# Patient Record
Sex: Female | Born: 2000 | Hispanic: Yes | Marital: Single | Smoking: Never smoker
Health system: Southern US, Community
[De-identification: ages and names within clinical notes are randomized; demographics above are authoritative.]

## PROBLEM LIST (undated history)

## (undated) ENCOUNTER — Inpatient Hospital Stay (HOSPITAL_COMMUNITY): Payer: Self-pay

## (undated) DIAGNOSIS — F32A Depression, unspecified: Secondary | ICD-10-CM

## (undated) DIAGNOSIS — D649 Anemia, unspecified: Secondary | ICD-10-CM

## (undated) HISTORY — DX: Depression, unspecified: F32.A

---
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
## 2021-11-23 ENCOUNTER — Emergency Department (HOSPITAL_COMMUNITY): Payer: Medicaid Other

## 2021-11-23 ENCOUNTER — Emergency Department (HOSPITAL_COMMUNITY)
Admission: EM | Admit: 2021-11-23 | Discharge: 2021-11-23 | Disposition: A | Discharge status: Home / Self Care | Payer: Medicaid Other | Attending: Emergency Medicine | Admitting: Emergency Medicine

## 2021-11-23 ENCOUNTER — Other Ambulatory Visit: Payer: Self-pay

## 2021-11-23 ENCOUNTER — Encounter (HOSPITAL_COMMUNITY): Payer: Self-pay

## 2021-11-23 DIAGNOSIS — M545 Low back pain, unspecified: Secondary | ICD-10-CM | POA: Diagnosis not present

## 2021-11-23 DIAGNOSIS — Y92410 Unspecified street and highway as the place of occurrence of the external cause: Secondary | ICD-10-CM | POA: Insufficient documentation

## 2021-11-23 DIAGNOSIS — G8911 Acute pain due to trauma: Secondary | ICD-10-CM | POA: Insufficient documentation

## 2021-11-23 DIAGNOSIS — M542 Cervicalgia: Secondary | ICD-10-CM | POA: Insufficient documentation

## 2021-11-23 NOTE — ED Notes (Signed)
Pt reported to RN that she needed to leave because her boyfriend was leaving the ED and that is her only ride home. RN encouraged pt to stay to allow the MD to provide her with results. Again, pt insisted on leaving due to transportation issues. Pt seen ambulating out of ED, steady gait, NAD. Return precautions provided prior to departure.
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
## 2022-08-11 DIAGNOSIS — O35EXX0 Maternal care for other (suspected) fetal abnormality and damage, fetal genitourinary anomalies, not applicable or unspecified: Secondary | ICD-10-CM | POA: Insufficient documentation

## 2022-08-11 HISTORY — DX: Maternal care for other (suspected) fetal abnormality and damage, fetal genitourinary anomalies, not applicable or unspecified: O35.EXX0

## 2022-08-23 ENCOUNTER — Encounter (HOSPITAL_COMMUNITY): Payer: Self-pay | Admitting: Obstetrics and Gynecology

## 2022-08-23 ENCOUNTER — Inpatient Hospital Stay (HOSPITAL_BASED_OUTPATIENT_CLINIC_OR_DEPARTMENT_OTHER): Payer: Medicaid Other

## 2022-08-23 ENCOUNTER — Emergency Department (HOSPITAL_COMMUNITY)
Admission: EM | Admit: 2022-08-23 | Discharge: 2022-08-23 | Discharge status: Against Medical Advice | Payer: Medicaid Other | Source: Home / Self Care

## 2022-08-23 ENCOUNTER — Inpatient Hospital Stay (HOSPITAL_COMMUNITY)
Admission: AD | Admit: 2022-08-23 | Discharge: 2022-08-23 | Disposition: A | Discharge status: Home / Self Care | Payer: Medicaid Other | Attending: Obstetrics and Gynecology | Admitting: Obstetrics and Gynecology

## 2022-08-23 DIAGNOSIS — Y929 Unspecified place or not applicable: Secondary | ICD-10-CM | POA: Insufficient documentation

## 2022-08-23 DIAGNOSIS — T7491XA Unspecified adult maltreatment, confirmed, initial encounter: Secondary | ICD-10-CM

## 2022-08-23 DIAGNOSIS — O99012 Anemia complicating pregnancy, second trimester: Secondary | ICD-10-CM | POA: Insufficient documentation

## 2022-08-23 DIAGNOSIS — O9A219 Injury, poisoning and certain other consequences of external causes complicating pregnancy, unspecified trimester: Secondary | ICD-10-CM | POA: Diagnosis present

## 2022-08-23 DIAGNOSIS — S3991XA Unspecified injury of abdomen, initial encounter: Secondary | ICD-10-CM | POA: Insufficient documentation

## 2022-08-23 DIAGNOSIS — Z3A20 20 weeks gestation of pregnancy: Secondary | ICD-10-CM | POA: Diagnosis not present

## 2022-08-23 DIAGNOSIS — O9A212 Injury, poisoning and certain other consequences of external causes complicating pregnancy, second trimester: Secondary | ICD-10-CM

## 2022-08-23 DIAGNOSIS — O26892 Other specified pregnancy related conditions, second trimester: Secondary | ICD-10-CM | POA: Insufficient documentation

## 2022-08-23 LAB — CBC
HCT: 30.4 % — ABNORMAL LOW (ref 36.0–46.0)
Hemoglobin: 10.5 g/dL — ABNORMAL LOW (ref 12.0–15.0)
MCH: 31.3 pg (ref 26.0–34.0)
MCHC: 34.5 g/dL (ref 30.0–36.0)
MCV: 90.5 fL (ref 80.0–100.0)
Platelets: 214 10*3/uL (ref 150–400)
RBC: 3.36 MIL/uL — ABNORMAL LOW (ref 3.87–5.11)
RDW: 13.1 % (ref 11.5–15.5)
WBC: 9 10*3/uL (ref 4.0–10.5)
nRBC: 0 % (ref 0.0–0.2)

## 2022-08-23 MED ORDER — CYCLOBENZAPRINE HCL 5 MG PO TABS
10.0000 mg | ORAL_TABLET | Freq: Once | ORAL | Status: AC
  Administered 2022-08-23: 10 mg via ORAL
  Filled 2022-08-23: qty 2
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
## 2023-03-15 ENCOUNTER — Emergency Department (HOSPITAL_COMMUNITY)
Admission: EM | Admit: 2023-03-15 | Discharge: 2023-03-16 | Disposition: A | Discharge status: Home / Self Care | Payer: Medicaid Other | Attending: Emergency Medicine | Admitting: Emergency Medicine

## 2023-03-15 DIAGNOSIS — Z1152 Encounter for screening for COVID-19: Secondary | ICD-10-CM | POA: Insufficient documentation

## 2023-03-15 DIAGNOSIS — R509 Fever, unspecified: Secondary | ICD-10-CM | POA: Diagnosis present

## 2023-03-15 DIAGNOSIS — B349 Viral infection, unspecified: Secondary | ICD-10-CM

## 2023-03-16 ENCOUNTER — Other Ambulatory Visit: Payer: Self-pay

## 2023-03-16 ENCOUNTER — Encounter (HOSPITAL_COMMUNITY): Payer: Self-pay | Admitting: *Deleted

## 2023-03-16 LAB — RESP PANEL BY RT-PCR (RSV, FLU A&B, COVID)  RVPGX2
Influenza A by PCR: NEGATIVE
Influenza B by PCR: POSITIVE — AB
Resp Syncytial Virus by PCR: NEGATIVE
SARS Coronavirus 2 by RT PCR: NEGATIVE

## 2023-03-16 MED ORDER — ACETAMINOPHEN 325 MG PO TABS
650.0000 mg | ORAL_TABLET | Freq: Once | ORAL | Status: AC | PRN
  Administered 2023-03-16: 650 mg via ORAL
  Filled 2023-03-16: qty 2

## 2023-03-16 NOTE — ED Triage Notes (Addendum)
Pt has been having a fever since Friday, unrelieved with OTC medications. Reports recent sickness of her child. C/o headache. Pt is 2 months postpartum and breast feeding, denies any issues breastfeeding

## 2023-03-16 NOTE — Discharge Instructions (Addendum)
We recommend Delsym and/or Benzocaine cough drops/lozenges for management of cough and sore throat.  These can be purchased over-the-counter at your local pharmacy.  A teaspoon of honey can also help reduce cough frequency.  Continue Tylenol and/or ibuprofen every 6 hours for fever management.  Follow-up with your primary care doctor.  You may also follow the results of your respiratory panel using MyChart.  Return for new or concerning symptoms.

## 2023-03-16 NOTE — ED Provider Notes (Signed)
[place] EMERGENCY DEPARTMENT AT [facility] Provider Note   CSN: [id number] Arrival date & time: 03/15/23  2318     History  Chief Complaint  Patient presents with   Fever    Sarah Bond is a 22 y.o. female.  22 year old female presents to the emergency department for evaluation of fever.  Fever has been tactile since Friday with associated fatigue, body aches, cough and sore throat.  Reports that her 42-year-old son was sick with similar symptoms 1 week ago which have since resolved.  She has been using over-the-counter Tylenol and ibuprofen for fever with mild improvement.  Continues to experience chills and myalgias.  No nausea, vomiting, diarrhea, shortness of breath.  She is 2 months postpartum and is presently breast-feeding.  The history is provided by the patient. No language interpreter was used.  Fever      Home Medications Prior to Admission medications   Medication Sig Start Date End Date Taking? Authorizing Provider  ferrous sulfate 325 (65 FE) MG tablet Take 1 tablet (325 mg total) by mouth 2 (two) times daily with a meal. 09/08/18 11/07/18  Allayne Stack, DO  FLUoxetine (PROZAC) 10 MG capsule Take 2 capsules (20 mg total) by mouth daily. 11/01/18   Marylene Land, CNM  ibuprofen (ADVIL,MOTRIN) 600 MG tablet Take 1 tablet (600 mg total) by mouth every 6 (six) hours as needed. 09/08/18   Allayne Stack, DO  Prenatal Vit-Fe Fumarate-FA (MULTIVITAMIN-PRENATAL) 27-0.8 MG TABS tablet Take 1 tablet by mouth daily at 12 noon.    [provider]  senna-docusate (SENOKOT-S) 8.6-50 MG tablet Take 2 tablets by mouth daily. Patient not taking: Reported on 11/01/2018 09/09/18   Allayne Stack, DO      Allergies    Patient has no known allergies.    Review of Systems   Review of Systems  Constitutional:  Positive for fever.  Ten systems reviewed and are negative for acute change, except as noted in the HPI.    Physical  Exam Updated Vital Signs BP 96/74   Pulse 91   Temp 98.6 F (37 C) (Oral)   Resp 16   Ht 5\' 5"  (1.651 m)   Wt 63.5 kg   LMP  (LMP Unknown) Comment: recent abortion two weeks ago-waiver signed  SpO2 96%   BMI 23.30 kg/m   Physical Exam Vitals and nursing note reviewed.  Constitutional:      General: She is not in acute distress.    Appearance: She is well-developed. She is not diaphoretic.     Comments: Ill appearing; fatigued. Nontoxic.  HENT:     Head: Normocephalic and atraumatic.     Mouth/Throat:     Mouth: Mucous membranes are moist.     Comments: No posterior oropharyngeal erythema, edema, exudates.  Uvula midline with normal phonation.  Patient tolerating secretions without difficulty. Eyes:     General: No scleral icterus.    Conjunctiva/sclera: Conjunctivae normal.  Neck:     Comments: No meningismus Cardiovascular:     Rate and Rhythm: Regular rhythm. Tachycardia present.     Pulses: Normal pulses.     Comments: Tachycardia likely secondary to fever Pulmonary:     Effort: Pulmonary effort is normal. No respiratory distress.     Breath sounds: No stridor. No wheezing.     Comments: Respirations even and unlabored Musculoskeletal:        General: Normal range of motion.     Cervical back: Normal range of  motion.  Skin:    General: Skin is warm and dry.     Coloration: Skin is not pale.     Findings: No erythema or rash.  Neurological:     Mental Status: She is alert and oriented to person, place, and time.     Coordination: Coordination normal.  Psychiatric:        Behavior: Behavior normal.     ED Results / Procedures / Treatments   Labs (all labs ordered are listed, but only abnormal results are displayed) Labs Reviewed  RESP PANEL BY RT-PCR (RSV, FLU A&B, COVID)  [id number]    EKG None  Radiology No results found.  Procedures Procedures    Medications Ordered in ED Medications  acetaminophen (TYLENOL) tablet 650 mg (650 mg Oral Given  03/16/23 0039)    ED Course/ Medical Decision Making/ A&P Clinical Course as of 03/16/23 0343  Tue Mar 16, 2023  0332 Patient resting comfortably and feeling better.  Fever has defervesced with improvement in vital signs. [KH]    Clinical Course User Index [KH] Antony Madura, PA-C                             Medical Decision Making Risk OTC drugs.   This patient presents to the ED for concern of febrile illness, this involves an extensive number of treatment options, and is a complaint that carries with it a high risk of complications and morbidity.  The differential diagnosis includes viral syndrome vs strep throat vs PNA vs sepsis   Co morbidities that complicate the patient evaluation  2 months postpartum   Additional history obtained:  External records from outside source obtained and reviewed including delivery records from 12/31/22   Lab Tests:  I Ordered, and personally interpreted labs.  The pertinent results include:  respiratory viral panel pending.   Cardiac Monitoring:  The patient was maintained on a cardiac monitor.  I personally viewed and interpreted the cardiac monitored which showed an underlying rhythm of: sinus tachycardia > NSR   Medicines ordered and prescription drug management:  I ordered medication including tylenol for fever  Reevaluation of the patient after these medicines showed that the patient improved I have reviewed the patients home medicines and have made adjustments as needed   Test Considered:  CXR -however no tachypnea, dyspnea, hypoxia to suggest pneumonia.  No complaints of shortness of breath or chest pain.   Problem List / ED Course:  Patient presenting for febrile illness.  Fever is tactile, but responding appropriately to antipyretics.  Reports that 65-year-old son had similar URI symptoms and fever last week.  Likely viral syndrome, though respiratory panel has yet to result. Given improvement in fever and vital signs,  general well-appearing nature patient, plan for discharge and primary care follow-up.  Counseled on supportive care instructions as well as return precautions.   Reevaluation:  After the interventions noted above, I reevaluated the patient and found that they have :improved   Social Determinants of Health:  Breastfeeding    Dispostion:  After consideration of the diagnostic results and the patients response to treatment, I feel that the patent would benefit from supportive care and PCP follow up. Return precautions discussed and provided. Patient discharged in stable condition with no unaddressed concerns.          Final Clinical Impression(s) / ED Diagnoses Final diagnoses:  Viral syndrome    Rx / DC Orders ED Discharge Orders  None         Antony Madura, PA-C 03/16/23 1610    Shon Baton, MD 03/16/23 [phone]

## 2023-09-05 DIAGNOSIS — Z5321 Procedure and treatment not carried out due to patient leaving prior to being seen by health care provider: Secondary | ICD-10-CM | POA: Insufficient documentation

## 2023-09-05 DIAGNOSIS — N939 Abnormal uterine and vaginal bleeding, unspecified: Secondary | ICD-10-CM | POA: Diagnosis present

## 2023-09-06 ENCOUNTER — Encounter (HOSPITAL_COMMUNITY): Payer: Self-pay

## 2023-09-06 ENCOUNTER — Other Ambulatory Visit: Payer: Self-pay

## 2023-09-06 ENCOUNTER — Emergency Department (HOSPITAL_COMMUNITY)
Admission: EM | Admit: 2023-09-06 | Discharge: 2023-09-06 | Discharge status: Against Medical Advice | Payer: Medicaid Other | Attending: Emergency Medicine | Admitting: Emergency Medicine

## 2023-09-06 LAB — CBC
HCT: 34.5 % — ABNORMAL LOW (ref 36.0–46.0)
Hemoglobin: 11.1 g/dL — ABNORMAL LOW (ref 12.0–15.0)
MCH: 30.6 pg (ref 26.0–34.0)
MCHC: 32.2 g/dL (ref 30.0–36.0)
MCV: 95 fL (ref 80.0–100.0)
Platelets: 214 10*3/uL (ref 150–400)
RBC: 3.63 MIL/uL — ABNORMAL LOW (ref 3.87–5.11)
RDW: 13.5 % (ref 11.5–15.5)
WBC: 4.4 10*3/uL (ref 4.0–10.5)
nRBC: 0 % (ref 0.0–0.2)

## 2023-09-06 LAB — HCG, SERUM, QUALITATIVE: Preg, Serum: NEGATIVE

## 2023-09-06 NOTE — ED Triage Notes (Signed)
Complaining of having heavy vaginal bleeding for the last couple of days. Today she was passing golfball size blood clots. She said she feels weak. Has not been to her GYN

## 2023-09-06 NOTE — ED Notes (Signed)
Patient left.
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
## 2023-11-05 ENCOUNTER — Emergency Department (HOSPITAL_BASED_OUTPATIENT_CLINIC_OR_DEPARTMENT_OTHER)
Admission: EM | Admit: 2023-11-05 | Discharge: 2023-11-05 | Disposition: A | Discharge status: Home / Self Care | Payer: Medicaid Other | Attending: Emergency Medicine | Admitting: Emergency Medicine

## 2023-11-05 ENCOUNTER — Other Ambulatory Visit: Payer: Self-pay

## 2023-11-05 ENCOUNTER — Emergency Department (HOSPITAL_BASED_OUTPATIENT_CLINIC_OR_DEPARTMENT_OTHER): Payer: Medicaid Other

## 2023-11-05 ENCOUNTER — Encounter (HOSPITAL_BASED_OUTPATIENT_CLINIC_OR_DEPARTMENT_OTHER): Payer: Self-pay

## 2023-11-05 DIAGNOSIS — S61217A Laceration without foreign body of left little finger without damage to nail, initial encounter: Secondary | ICD-10-CM | POA: Diagnosis not present

## 2023-11-05 DIAGNOSIS — S61211A Laceration without foreign body of left index finger without damage to nail, initial encounter: Secondary | ICD-10-CM | POA: Insufficient documentation

## 2023-11-05 DIAGNOSIS — S61215A Laceration without foreign body of left ring finger without damage to nail, initial encounter: Secondary | ICD-10-CM | POA: Insufficient documentation

## 2023-11-05 DIAGNOSIS — Y93H2 Activity, gardening and landscaping: Secondary | ICD-10-CM | POA: Diagnosis not present

## 2023-11-05 DIAGNOSIS — S61213A Laceration without foreign body of left middle finger without damage to nail, initial encounter: Secondary | ICD-10-CM | POA: Insufficient documentation

## 2023-11-05 DIAGNOSIS — W268XXA Contact with other sharp object(s), not elsewhere classified, initial encounter: Secondary | ICD-10-CM | POA: Diagnosis not present

## 2023-11-05 DIAGNOSIS — S41112A Laceration without foreign body of left upper arm, initial encounter: Secondary | ICD-10-CM

## 2023-11-05 DIAGNOSIS — S61012A Laceration without foreign body of left thumb without damage to nail, initial encounter: Secondary | ICD-10-CM | POA: Insufficient documentation

## 2023-11-05 MED ORDER — ACETAMINOPHEN 325 MG PO TABS
650.0000 mg | ORAL_TABLET | Freq: Once | ORAL | Status: AC
  Administered 2023-11-05: 650 mg via ORAL
  Filled 2023-11-05: qty 2

## 2023-11-05 MED ORDER — OXYCODONE-ACETAMINOPHEN 5-325 MG PO TABS
1.0000 | ORAL_TABLET | Freq: Once | ORAL | Status: DC
  Filled 2023-11-05: qty 1

## 2023-11-05 MED ORDER — BUPIVACAINE HCL (PF) 0.5 % IJ SOLN
10.0000 mL | Freq: Once | INTRAMUSCULAR | Status: DC
  Filled 2023-11-05: qty 10
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
## 2023-11-05 NOTE — ED Provider Notes (Signed)
[place] EMERGENCY DEPARTMENT AT [facility] Provider Note   CSN: [id number] Arrival date & time: 11/05/23  1553     History  Chief Complaint  Patient presents with   Laceration    L hand    Sarah Bond is a 22 y.o. female presents today after cutting the palmar aspect of all 5 fingers while trimming some bushes.  Patient endorses pain.  Patient denies numbness or tingling.  Patient states that her tetanus is up-to-date.  Bleeding controlled with direct pressure.   Laceration      Home Medications Prior to Admission medications   Medication Sig Start Date End Date Taking? Authorizing Provider  ferrous sulfate 325 (65 FE) MG tablet Take 1 tablet (325 mg total) by mouth 2 (two) times daily with a meal. 09/08/18 11/07/18  Allayne Stack, DO  FLUoxetine (PROZAC) 10 MG capsule Take 2 capsules (20 mg total) by mouth daily. 11/01/18   Marylene Land, CNM  ibuprofen (ADVIL,MOTRIN) 600 MG tablet Take 1 tablet (600 mg total) by mouth every 6 (six) hours as needed. 09/08/18   Allayne Stack, DO  Prenatal Vit-Fe Fumarate-FA (MULTIVITAMIN-PRENATAL) 27-0.8 MG TABS tablet Take 1 tablet by mouth daily at 12 noon.    [provider]  senna-docusate (SENOKOT-S) 8.6-50 MG tablet Take 2 tablets by mouth daily. Patient not taking: Reported on 11/01/2018 09/09/18   Allayne Stack, DO      Allergies    Patient has no known allergies.    Review of Systems   Review of Systems  Musculoskeletal:  Positive for arthralgias.    Physical Exam Updated Vital Signs BP 106/83 (BP Location: Right Arm)   Pulse 95   Temp 97.9 F (36.6 C)   Resp 16   LMP 10/29/2023   SpO2 100%  Physical Exam Vitals and nursing note reviewed.  Constitutional:      General: She is not in acute distress.    Appearance: She is well-developed.  HENT:     Head: Normocephalic and atraumatic.     Right Ear: External ear normal.     Left Ear: External ear normal.  Eyes:      Conjunctiva/sclera: Conjunctivae normal.  Cardiovascular:     Rate and Rhythm: Normal rate and regular rhythm.     Pulses: Normal pulses.  Pulmonary:     Effort: Pulmonary effort is normal.     Breath sounds: Normal breath sounds.  Abdominal:     Palpations: Abdomen is soft.     Tenderness: There is no abdominal tenderness.  Musculoskeletal:        General: No swelling.     Cervical back: Normal range of motion and neck supple.  Skin:    General: Skin is warm and dry.     Capillary Refill: Capillary refill takes less than 2 seconds.     Comments: Approximately 1 cm lacerations to the palmar aspect of the second third fourth and fifth fingers near the PIP.  1 cm [place] shaped laceration to the thumb at the DIP.  Patient has full range of motion and is neurovascularly intact.  Neurological:     General: No focal deficit present.     Mental Status: She is alert.  Psychiatric:        Mood and Affect: Mood normal.     ED Results / Procedures / Treatments   Labs (all labs ordered are listed, but only abnormal results are displayed) Labs Reviewed - No data to display  EKG None  Radiology DG Hand Complete Left Result Date: 11/05/2023 CLINICAL DATA:  Laceration of multiple fingers EXAM: LEFT HAND - COMPLETE 3+ VIEW COMPARISON:  None Available. FINDINGS: Overlying bandaging material slightly degrades assessment of the thumb, long finger, ring finger, and small finger. There is no evidence of fracture or dislocation. There is no evidence of arthropathy or other focal bone abnormality. No appreciable soft tissue swelling within the limitations of this exam. No metallic foreign body is evident. IMPRESSION: No acute osseous abnormality or metallic foreign body of the left hand. Electronically Signed   By: Duanne Guess D.O.   On: 11/05/2023 18:59    Procedures .Laceration Repair  Date/Time: 11/05/2023 7:27 PM  Performed by: Dolphus Jenny, PA-C Authorized by: Dolphus Jenny,  PA-C   Consent:    Consent obtained:  Verbal   Consent given by:  Patient   Risks discussed:  Pain and poor wound healing   Alternatives discussed:  No treatment Universal protocol:    Patient identity confirmed:  Verbally with patient Anesthesia:    Anesthesia method:  Local infiltration   Local anesthetic:  Bupivacaine 0.5% w/o epi Laceration details:    Location:  Finger   Finger location:  L thumb   Length (cm):  1   Depth (mm):  2 Exploration:    Hemostasis achieved with:  Direct pressure   Imaging obtained: x-ray     Imaging outcome: foreign body not noted     Wound exploration: wound explored through full range of motion and entire depth of wound visualized   Treatment:    Area cleansed with:  Chlorhexidine   Amount of cleaning:  Extensive   Irrigation solution:  Sterile saline   Irrigation method:  Pressure wash Skin repair:    Repair method:  Sutures   Suture size:  5-0   Suture material:  Nylon   Number of sutures:  3 Approximation:    Approximation:  Close Repair type:    Repair type:  Simple Post-procedure details:    Dressing:  Non-adherent dressing   Procedure completion:  Tolerated .Laceration Repair  Date/Time: 11/05/2023 7:28 PM  Performed by: Dolphus Jenny, PA-C Authorized by: Dolphus Jenny, PA-C   Consent:    Consent obtained:  Verbal   Consent given by:  Patient   Risks discussed:  Pain, infection and poor wound healing   Alternatives discussed:  No treatment Universal protocol:    Patient identity confirmed:  Verbally with patient Anesthesia:    Anesthesia method:  Local infiltration   Local anesthetic:  Bupivacaine 0.5% w/o epi Laceration details:    Location:  Finger   Finger location:  L small finger   Length (cm):  1   Depth (mm):  2 Exploration:    Hemostasis achieved with:  Direct pressure   Imaging obtained: x-ray     Imaging outcome: foreign body not noted     Wound exploration: wound explored through full range of motion  and entire depth of wound visualized   Treatment:    Area cleansed with:  Chlorhexidine   Amount of cleaning:  Extensive   Irrigation solution:  Sterile saline   Irrigation method:  Pressure wash Skin repair:    Repair method:  Sutures   Suture size:  5-0   Suture material:  Nylon   Number of sutures:  3 Approximation:    Approximation:  Close Repair type:    Repair type:  Simple Post-procedure details:    Procedure  completion:  Tolerated     Medications Ordered in ED Medications  bupivacaine(PF) (MARCAINE) 0.5 % injection 10 mL (has no administration in time range)  acetaminophen (TYLENOL) tablet 650 mg (650 mg Oral Given 11/05/23 1709)    ED Course/ Medical Decision Making/ A&P                                 Medical Decision Making Amount and/or Complexity of Data Reviewed Radiology: ordered.  Risk Prescription drug management.   This patient presents to the ED with chief complaint(s) of left hand lacerations with pertinent past medical history of none which further complicates the presenting complaint. The complaint involves an extensive differential diagnosis and also carries with it a high risk of complications and morbidity.    The differential diagnosis includes laceration, fracture  ED Course and Reassessment:   Independent visualization of imaging: - I independently visualized the following imaging with scope of interpretation limited to determining acute life threatening conditions related to emergency care: Left hand x-ray, which revealed no acute osseous abnormality or foreign body noted  Consultation: - Consulted or discussed management/test interpretation w/ external professional: None  Consideration for admission or further workup: Considered for admission or further workup however patient's vital signs, physical exam, and imaging of all been reassuring.  Patient should follow-up with PCP in approximately 1 week for suture  removal.        Final Clinical Impression(s) / ED Diagnoses Final diagnoses:  Laceration of multiple sites of left upper extremity, initial encounter    Rx / DC Orders ED Discharge Orders     None         Dolphus Jenny, PA-C 11/05/23 1930    Ernie Avena, MD 11/05/23 1942

## 2023-11-10 ENCOUNTER — Other Ambulatory Visit: Payer: Self-pay

## 2023-11-10 ENCOUNTER — Encounter: Payer: Self-pay | Admitting: *Deleted

## 2023-11-10 ENCOUNTER — Ambulatory Visit
Admission: EM | Admit: 2023-11-10 | Discharge: 2023-11-10 | Disposition: A | Discharge status: Home / Self Care | Payer: Medicaid Other | Attending: Internal Medicine | Admitting: Internal Medicine

## 2023-11-10 DIAGNOSIS — S61412A Laceration without foreign body of left hand, initial encounter: Secondary | ICD-10-CM | POA: Diagnosis not present

## 2023-11-10 DIAGNOSIS — S61219A Laceration without foreign body of unspecified finger without damage to nail, initial encounter: Secondary | ICD-10-CM | POA: Diagnosis not present

## 2023-11-10 NOTE — ED Provider Notes (Signed)
[facility]    CSN: [id number] Arrival date & time: 11/10/23  1047      History   Chief Complaint Chief Complaint  Patient presents with   Wound Check    HPI Sarah Bond is a 22 y.o. female.    Wound Check  Multiple lacerations left hand cut by hedge tremor 5 days ago, seen in the emergency department states 2 of her fingers were sutured 2 of them were glued.  He has concerns due to drainage from the glued wounds, also complaining of numbness in her little finger.  Nuys fever, chills, sweats, redness, swelling  Past Medical History:  Diagnosis Date   Anemia    iron infusion during pregnancy    Patient Active Problem List   Diagnosis Date Noted   Postpartum care and examination 11/01/2018   Nexplanon in place 09/08/2018   Labor and delivery, indication for care 09/06/2018   Post-dates pregnancy 09/06/2018   Anemia affecting pregnancy 09/06/2018   Rubella non-immune status, antepartum 03/25/2018   Major depression, single episode 03/21/2015   Suicide attempt (HCC) 03/21/2015   Generalized anxiety disorder 03/21/2015    Past Surgical History:  Procedure Laterality Date   I & D EXTREMITY Left 08/08/2015   Procedure: IRRIGATION AND DEBRIDEMENT LEFT THUMB WITH REPAIR AND RECONSTRUCTION AS NEEDED;  Surgeon: Dominica Severin, MD;  Location: [facility] OR;  Service: Orthopedics;  Laterality: Left;    OB History     Gravida  2   Para  1   Term  1   Preterm  0   AB  0   Living  1      SAB  0   IAB  0   Ectopic  0   Multiple  0   Live Births  1            Home Medications    Prior to Admission medications   Medication Sig Start Date End Date Taking? Authorizing Provider  ferrous sulfate 325 (65 FE) MG tablet Take 1 tablet (325 mg total) by mouth 2 (two) times daily with a meal. Patient not taking: Reported on 11/10/2023 09/08/18 11/07/18  Allayne Stack, DO  FLUoxetine (PROZAC) 10 MG capsule Take 2 capsules (20 mg total) by mouth  daily. Patient not taking: Reported on 11/10/2023 11/01/18   Marylene Land, CNM  ibuprofen (ADVIL,MOTRIN) 600 MG tablet Take 1 tablet (600 mg total) by mouth every 6 (six) hours as needed. Patient not taking: Reported on 11/10/2023 09/08/18   Allayne Stack, DO  Prenatal Vit-Fe Fumarate-FA (MULTIVITAMIN-PRENATAL) 27-0.8 MG TABS tablet Take 1 tablet by mouth daily at 12 noon. Patient not taking: Reported on 11/10/2023    [provider]  senna-docusate (SENOKOT-S) 8.6-50 MG tablet Take 2 tablets by mouth daily. Patient not taking: Reported on 11/01/2018 09/09/18   Allayne Stack, DO    Family History Family History  Problem Relation Age of Onset   Diabetes Mother     Social History Social History   Tobacco Use   Smoking status: Never   Smokeless tobacco: Never  Vaping Use   Vaping status: Never Used  Substance Use Topics   Alcohol use: No   Drug use: Never     Allergies   Patient has no known allergies.   Review of Systems Review of Systems   Physical Exam Triage Vital Signs ED Triage Vitals  Encounter Vitals Group     BP 11/10/23 1143 103/72     Systolic  BP Percentile --      Diastolic BP Percentile --      Pulse Rate 11/10/23 1143 74     Resp 11/10/23 1143 16     Temp 11/10/23 1143 98.4 F (36.9 C)     Temp Source 11/10/23 1143 Oral     SpO2 11/10/23 1143 98 %     Weight --      Height --      Head Circumference --      Peak Flow --      Pain Score 11/10/23 1140 4     Pain Loc --      Pain Education --      Exclude from Growth Chart --    No data found.  Updated Vital Signs BP 103/72 (BP Location: Right Arm)   Pulse 74   Temp 98.4 F (36.9 C) (Oral)   Resp 16   LMP 10/29/2023   SpO2 98%   Breastfeeding Yes   Visual Acuity Right Eye Distance:   Left Eye Distance:   Bilateral Distance:    Right Eye Near:   Left Eye Near:    Bilateral Near:     Physical Exam Skin:    Comments: Left fifth finger and thumb have  sutured wounds with good healing no evidence of infection, index middle and ring fingers have wounds with glue, scant drainage, no erythema, is all digits, decreased sensation volar lateral aspect fifth finger. Digits with full range of motion, capillary refill brisk  Neurological:     Mental Status: She is alert and oriented to person, place, and time.  Psychiatric:        Mood and Affect: Mood normal.      UC Treatments / Results  Labs (all labs ordered are listed, but only abnormal results are displayed) Labs Reviewed - No data to display  EKG   Radiology No results found.  Procedures Procedures (including critical care time)  Medications Ordered in UC Medications - No data to display  Initial Impression / Assessment and Plan / UC Course  I have reviewed the triage vital signs and the nursing notes.  Pertinent labs & imaging results that were available during my care of the patient were reviewed by me and considered in my medical decision making (see chart for details).     22 year old female with multiple wounds left hand, wounds were cleaned with Hibiclens, skin glue came off during the cleaning process, middle and ring finger wounds are moist and macerated appearing, there is no evidence of infection. Home management of wounds reviewed with patient, keep wounds clean, dry and covered.  Recommend she follow-up with hand regarding numbness in the pinky.  She was given a list of local orthopedists Final Clinical Impressions(s) / UC Diagnoses   Final diagnoses:  Laceration of multiple sites of left hand and fingers, initial encounter     Discharge Instructions      Keep wounds clean, dry and covered Follow-up with a hand specialist as discussed regarding the numbness in your little finger Medical attention immediately for signs of infection clued redness, swelling, increased pain, pus    ED Prescriptions   None    PDMP not reviewed this encounter.    Meliton Rattan, [place] 11/10/23 1159

## 2023-11-10 NOTE — Discharge Instructions (Signed)
Keep wounds clean, dry and covered Follow-up with a hand specialist as discussed regarding the numbness in your little finger Medical attention immediately for signs of infection clued redness, swelling, increased pain, pus

## 2023-11-10 NOTE — ED Triage Notes (Signed)
Pt seen in ED on Friday for injury to left hand from hedge trimmers. She had stitches and glue to all fingers on left hand. States ring finger continues to bleed and she has seen 'pus" from ring finger and index finger on left hand

## 2023-11-14 ENCOUNTER — Ambulatory Visit (HOSPITAL_COMMUNITY)
Admission: EM | Admit: 2023-11-14 | Discharge: 2023-11-14 | Disposition: A | Discharge status: Home / Self Care | Payer: Medicaid Other | Attending: Emergency Medicine | Admitting: Emergency Medicine

## 2023-11-14 ENCOUNTER — Other Ambulatory Visit: Payer: Self-pay

## 2023-11-14 ENCOUNTER — Encounter (HOSPITAL_COMMUNITY): Payer: Self-pay | Admitting: *Deleted

## 2023-11-14 DIAGNOSIS — Z4802 Encounter for removal of sutures: Secondary | ICD-10-CM | POA: Diagnosis not present

## 2023-11-14 NOTE — ED Provider Notes (Signed)
[facility]    CSN: [id number] Arrival date & time: 11/14/23  1500      History   Chief Complaint Chief Complaint  Patient presents with   Suture / Staple Removal    HPI Sarah Bond is a 22 y.o. female.   Patient presents to clinic requesting suture removal.  She has 3 sutures in her left thumb, and 3 sutures in her left pinky, both palmar aspect.  Areas are scabbed, no distal swelling, erythema or drainage.  Patient does have some numbness distally in her pinky.  Range of motion intact.  The history is provided by the patient and medical records.  Suture / Staple Removal    Past Medical History:  Diagnosis Date   Anemia    iron infusion during pregnancy    Patient Active Problem List   Diagnosis Date Noted   Postpartum care and examination 11/01/2018   Nexplanon in place 09/08/2018   Labor and delivery, indication for care 09/06/2018   Post-dates pregnancy 09/06/2018   Anemia affecting pregnancy 09/06/2018   Rubella non-immune status, antepartum 03/25/2018   Major depression, single episode 03/21/2015   Suicide attempt (HCC) 03/21/2015   Generalized anxiety disorder 03/21/2015    Past Surgical History:  Procedure Laterality Date   I & D EXTREMITY Left 08/08/2015   Procedure: IRRIGATION AND DEBRIDEMENT LEFT THUMB WITH REPAIR AND RECONSTRUCTION AS NEEDED;  Surgeon: Dominica Severin, MD;  Location: [facility] OR;  Service: Orthopedics;  Laterality: Left;    OB History     Gravida  2   Para  1   Term  1   Preterm  0   AB  0   Living  1      SAB  0   IAB  0   Ectopic  0   Multiple  0   Live Births  1            Home Medications    Prior to Admission medications   Medication Sig Start Date End Date Taking? Authorizing Provider  ferrous sulfate 325 (65 FE) MG tablet Take 1 tablet (325 mg total) by mouth 2 (two) times daily with a meal. Patient not taking: Reported on 11/10/2023 09/08/18 11/07/18  Allayne Stack, DO   FLUoxetine (PROZAC) 10 MG capsule Take 2 capsules (20 mg total) by mouth daily. Patient not taking: Reported on 11/10/2023 11/01/18   Marylene Land, CNM  ibuprofen (ADVIL,MOTRIN) 600 MG tablet Take 1 tablet (600 mg total) by mouth every 6 (six) hours as needed. Patient not taking: Reported on 11/10/2023 09/08/18   Allayne Stack, DO  Prenatal Vit-Fe Fumarate-FA (MULTIVITAMIN-PRENATAL) 27-0.8 MG TABS tablet Take 1 tablet by mouth daily at 12 noon. Patient not taking: Reported on 11/10/2023    [provider]  senna-docusate (SENOKOT-S) 8.6-50 MG tablet Take 2 tablets by mouth daily. Patient not taking: Reported on 11/01/2018 09/09/18   Allayne Stack, DO    Family History Family History  Problem Relation Age of Onset   Diabetes Mother     Social History Social History   Tobacco Use   Smoking status: Never   Smokeless tobacco: Never  Vaping Use   Vaping status: Never Used  Substance Use Topics   Alcohol use: No   Drug use: Never     Allergies   Patient has no known allergies.   Review of Systems Review of Systems  Per HPI  Physical Exam Triage Vital Signs ED Triage Vitals [11/14/23 1607]  Encounter Vitals Group     BP      Systolic BP Percentile      Diastolic BP Percentile      Pulse      Resp      Temp      Temp src      SpO2      Weight      Height      Head Circumference      Peak Flow      Pain Score 0     Pain Loc      Pain Education      Exclude from Growth Chart    No data found.  Updated Vital Signs BP 99/68   Pulse 72   Temp 98.4 F (36.9 C)   Resp 18   LMP 10/29/2023   SpO2 100%   Visual Acuity Right Eye Distance:   Left Eye Distance:   Bilateral Distance:    Right Eye Near:   Left Eye Near:    Bilateral Near:     Physical Exam Vitals and nursing note reviewed.  Constitutional:      Appearance: Normal appearance.  HENT:     Head: Normocephalic and atraumatic.     Right Ear: External ear  normal.     Left Ear: External ear normal.     Nose: Nose normal.     Mouth/Throat:     Mouth: Mucous membranes are moist.  Cardiovascular:     Rate and Rhythm: Normal rate.  Pulmonary:     Effort: Pulmonary effort is normal. No respiratory distress.  Musculoskeletal:        General: Normal range of motion.  Skin:    General: Skin is warm and dry.  Neurological:     General: No focal deficit present.     Mental Status: She is alert and oriented to person, place, and time.  Psychiatric:        Mood and Affect: Mood normal.        Behavior: Behavior normal.      UC Treatments / Results  Labs (all labs ordered are listed, but only abnormal results are displayed) Labs Reviewed - No data to display  EKG   Radiology No results found.  Procedures Procedures (including critical care time)  Medications Ordered in UC Medications - No data to display  Initial Impression / Assessment and Plan / UC Course  I have reviewed the triage vital signs and the nursing notes.  Pertinent labs & imaging results that were available during my care of the patient were reviewed by me and considered in my medical decision making (see chart for details).  Vitals and triage reviewed, patient is hemodynamically stable.  Staff to remove a total of 6 sutures from left thumb and left pinky.  Brisk capillary refill distal to pinky injury, low concern for neurovascular compromise.  Post suture removal skin care reviewed.  Plan of care, follow-up care return precautions given, no questions at this time.     Final Clinical Impressions(s) / UC Diagnoses   Final diagnoses:  Visit for suture removal   Discharge Instructions   None    ED Prescriptions   None    PDMP not reviewed this encounter.   Baran Kuhrt, [place], [place] 11/14/23 [phone]

## 2023-11-14 NOTE — ED Triage Notes (Signed)
Pt presents fro suture removal on Lt small finger and Lt thumb.

## 2023-11-14 NOTE — ED Notes (Addendum)
Sutures removed from Lt small finger and Lt thumb. 3 sutures removed from Lt small finger and 3 sutures removed from Lt thumb.

## 2023-11-24 NOTE — L&D Delivery Note (Addendum)
 LABOR COURSE Delsy came in for IOL secondary to short interval pregnancy, HSV1 (Valtrex ). She was 4 / 70 / -3 on arrival and AROM for moderate meconium at 04:35. She delivered a healthy baby boy at 35 and followed shortly after by the placenta at 42.   Delivery Note Called to room and patient was crowning. Head delivered ROA. No nuchal cord present. Shoulder and body delivered in usual fashion. At 0740 a healthy female was delivered via Vaginal, Spontaneous (Presentation: ROA. APGARs 8;9  ).  Infant with spontaneous cry, placed on mother's abdomen, dried and stimulated. Cord clamped x 2 after cord stopped pulsating, and cut by FOB. Cord blood drawn. Placenta delivered spontaneously with gentle cord traction. Placenta inspected and found to be intact but heavily calcified and with blood clots. Fundus firm with massage and Pitocin . Labia, perineum, vagina, and cervix inspected and intact  APGAR: 8,9 ; weight: 3810g Cord: 3VC Cord pH: not collected  Anesthesia: Nitrous Episiotomy: None Lacerations: None Suture Repair: None Est. Blood Loss (mL): 161  Mom to postpartum.  Baby to Couplet care / Skin to Skin. Placenta to pathology  Carilyn Friend, MD 08/31/24 7:57 AM     Attestation of Supervision of Resident: Evaluation and management procedures were performed by the learners: Family Medicine Resident under my supervision. I was immediately available for direct supervision, assistance and direction throughout this encounter.  I also confirm that I have verified the information documented in the resident's note, and that I have also personally reperformed the pertinent components of the physical exam and all of the medical decision making activities.  I have also made any necessary editorial changes.  Barabara Maier, DO FMOB Fellow, Faculty Practice [facility], [facility] [employer]

## 2024-03-03 ENCOUNTER — Inpatient Hospital Stay (HOSPITAL_COMMUNITY)

## 2024-03-03 ENCOUNTER — Inpatient Hospital Stay (HOSPITAL_COMMUNITY)
Admission: AD | Admit: 2024-03-03 | Discharge: 2024-03-03 | Disposition: A | Discharge status: Home / Self Care | Attending: Obstetrics & Gynecology | Admitting: Obstetrics & Gynecology

## 2024-03-03 ENCOUNTER — Other Ambulatory Visit: Payer: Self-pay

## 2024-03-03 DIAGNOSIS — G43919 Migraine, unspecified, intractable, without status migrainosus: Secondary | ICD-10-CM

## 2024-03-03 DIAGNOSIS — Z3A13 13 weeks gestation of pregnancy: Secondary | ICD-10-CM | POA: Insufficient documentation

## 2024-03-03 DIAGNOSIS — R109 Unspecified abdominal pain: Secondary | ICD-10-CM

## 2024-03-03 DIAGNOSIS — G43019 Migraine without aura, intractable, without status migrainosus: Secondary | ICD-10-CM | POA: Insufficient documentation

## 2024-03-03 DIAGNOSIS — R102 Pelvic and perineal pain: Secondary | ICD-10-CM | POA: Diagnosis present

## 2024-03-03 DIAGNOSIS — R1030 Lower abdominal pain, unspecified: Secondary | ICD-10-CM | POA: Diagnosis not present

## 2024-03-03 DIAGNOSIS — O99351 Diseases of the nervous system complicating pregnancy, first trimester: Secondary | ICD-10-CM | POA: Diagnosis not present

## 2024-03-03 DIAGNOSIS — R55 Syncope and collapse: Secondary | ICD-10-CM | POA: Diagnosis present

## 2024-03-03 DIAGNOSIS — Z113 Encounter for screening for infections with a predominantly sexual mode of transmission: Secondary | ICD-10-CM | POA: Diagnosis present

## 2024-03-03 DIAGNOSIS — O26891 Other specified pregnancy related conditions, first trimester: Secondary | ICD-10-CM | POA: Diagnosis not present

## 2024-03-03 LAB — CBC
HCT: 34.7 % — ABNORMAL LOW (ref 36.0–46.0)
Hemoglobin: 11.5 g/dL — ABNORMAL LOW (ref 12.0–15.0)
MCH: 28.1 pg (ref 26.0–34.0)
MCHC: 33.1 g/dL (ref 30.0–36.0)
MCV: 84.8 fL (ref 80.0–100.0)
Platelets: 247 10*3/uL (ref 150–400)
RBC: 4.09 MIL/uL (ref 3.87–5.11)
RDW: 15.8 % — ABNORMAL HIGH (ref 11.5–15.5)
WBC: 6 10*3/uL (ref 4.0–10.5)
nRBC: 0 % (ref 0.0–0.2)

## 2024-03-03 LAB — WET PREP, GENITAL
Clue Cells Wet Prep HPF POC: NONE SEEN
Sperm: NONE SEEN
Trich, Wet Prep: NONE SEEN
WBC, Wet Prep HPF POC: >=10 — AB (ref <10)
Yeast Wet Prep HPF POC: NONE SEEN

## 2024-03-03 LAB — URINALYSIS, ROUTINE W REFLEX MICROSCOPIC
Bilirubin Urine: NEGATIVE
Glucose, UA: NEGATIVE mg/dL
Hgb urine dipstick: NEGATIVE
Ketones, ur: NEGATIVE mg/dL
Leukocytes,Ua: NEGATIVE
Nitrite: NEGATIVE
Protein, ur: NEGATIVE mg/dL
Specific Gravity, Urine: 1.027 (ref 1.005–1.030)
pH: 6 (ref 5.0–8.0)

## 2024-03-03 LAB — ABO/RH: ABO/RH(D): O POS

## 2024-03-03 LAB — POCT PREGNANCY, URINE: Preg Test, Ur: POSITIVE — AB

## 2024-03-03 LAB — HCG, QUANTITATIVE, PREGNANCY: hCG, Beta Chain, Quant, S: 58486 m[IU]/mL — ABNORMAL HIGH (ref <5)

## 2024-03-03 MED ORDER — ACETAMINOPHEN-CAFFEINE 500-65 MG PO TABS
2.0000 | ORAL_TABLET | Freq: Once | ORAL | Status: AC
  Administered 2024-03-03: 2 via ORAL
  Filled 2024-03-03: qty 2

## 2024-03-03 NOTE — MAU Note (Signed)
 Sarah Bond is a 23 y.o. at Unknown here in MAU reporting: she was instructed by [facility] to be seen @ [facility] because she passed out on Monday and is having abdominal cramping.  Reports cramping began one week ago.  Denies VB.  LMP: irregular cycles, September 2024 Onset of complaint: 1 week Pain score: 5 Vitals:   03/03/24 1712  BP: 97/64  Pulse: 78  Resp: 19  Temp: 98.3 F (36.8 C)  SpO2: 100%     FHT: NA  Lab orders placed from triage: UPT

## 2024-03-03 NOTE — MAU Provider Note (Signed)
 History     CSN: [id number]  Arrival date and time: 03/03/24 1621   Event Date/Time   First Provider Initiated Contact with Patient 03/03/24 1823      No chief complaint on file.  Sarah Bond , a  23 y.o. G2P1001 at Unknown presents to MAU with complaints of mild abdominal cramping since last week.  She reports several positive home pregnancy test starting on Friday of last week.  Reports mild abdominal cramping mostly located on the right side that spontaneously resolves at 12 PM every day.  She denies attempting to relieve symptoms currently rates pain as a 0 out of 10.   Of note patient also reports new onset migraines.  Patient reports within the last week she has had a migraine every day that makes her feel like she wants to pass out.  Migraines worsened by light not sound.  Denies attempting to relieve symptoms currently rates pain as 7 out of 10.  Patient reports a migraine "so bad that she had a syncopal episode "on Monday of this week.  Reports her lips turning  blue , she passed out and has no recollection of events. Denies history of migraines prior pregnancy.  Patient reports shortly after that everything went back to normal.         OB History     Gravida  2   Para  1   Term  1   Preterm  0   AB  0   Living  1      SAB  0   IAB  0   Ectopic  0   Multiple  0   Live Births  1           Past Medical History:  Diagnosis Date   Anemia    iron infusion during pregnancy    Past Surgical History:  Procedure Laterality Date   I & D EXTREMITY Left 08/08/2015   Procedure: IRRIGATION AND DEBRIDEMENT LEFT THUMB WITH REPAIR AND RECONSTRUCTION AS NEEDED;  Surgeon: Dominica Severin, MD;  Location: [facility];  Service: Orthopedics;  Laterality: Left;    Family History  Problem Relation Age of Onset   Diabetes Mother     Social History   Tobacco Use   Smoking status: Never   Smokeless tobacco: Never  Vaping Use   Vaping status: Never Used   Substance Use Topics   Alcohol use: No   Drug use: Never    Allergies: No Known Allergies  Medications Prior to Admission  Medication Sig Dispense Refill Last Dose/Taking   ferrous sulfate 325 (65 FE) MG tablet Take 1 tablet (325 mg total) by mouth 2 (two) times daily with a meal. (Patient not taking: Reported on 11/10/2023) 60 tablet 1    FLUoxetine (PROZAC) 10 MG capsule Take 2 capsules (20 mg total) by mouth daily. (Patient not taking: Reported on 11/10/2023) 30 capsule 3    ibuprofen (ADVIL,MOTRIN) 600 MG tablet Take 1 tablet (600 mg total) by mouth every 6 (six) hours as needed. (Patient not taking: Reported on 11/10/2023) 30 tablet 0    Prenatal Vit-Fe Fumarate-FA (MULTIVITAMIN-PRENATAL) 27-0.8 MG TABS tablet Take 1 tablet by mouth daily at 12 noon. (Patient not taking: Reported on 11/10/2023)      senna-docusate (SENOKOT-S) 8.6-50 MG tablet Take 2 tablets by mouth daily. (Patient not taking: Reported on 11/01/2018) 15 tablet 0     Review of Systems  Constitutional:  Negative for chills, fatigue and fever.  Eyes:  Negative for pain and visual disturbance.  Respiratory:  Negative for apnea, shortness of breath and wheezing.   Cardiovascular:  Negative for chest pain and palpitations.  Gastrointestinal:  Positive for abdominal pain. Negative for constipation, diarrhea, nausea and vomiting.  Genitourinary:  Negative for difficulty urinating, dysuria, pelvic pain, vaginal bleeding, vaginal discharge and vaginal pain.  Musculoskeletal:  Negative for back pain.  Neurological:  Positive for dizziness, weakness, light-headedness and headaches. Negative for seizures.  Psychiatric/Behavioral:  Negative for suicidal ideas.    Physical Exam   Blood pressure 97/64, pulse 78, temperature 98.3 F (36.8 C), temperature source Oral, resp. rate 19, height 5\' 5"  (1.651 m), weight 61.3 kg, SpO2 100%, currently breastfeeding.  Physical Exam Vitals and nursing note reviewed.  Constitutional:       General: She is not in acute distress.    Appearance: Normal appearance.  HENT:     Head: Normocephalic.  Pulmonary:     Effort: Pulmonary effort is normal.  Abdominal:     Palpations: Abdomen is soft.     Tenderness: There is no abdominal tenderness.  Musculoskeletal:     Cervical back: Normal range of motion.  Skin:    General: Skin is warm and dry.  Neurological:     General: No focal deficit present.     Mental Status: She is alert and oriented to person, place, and time.     GCS: GCS eye subscore is 4. GCS verbal subscore is 5. GCS motor subscore is 6.     Coordination: Coordination is intact.     Gait: Gait normal.  Psychiatric:        Mood and Affect: Mood normal.     MAU Course  Procedures Orders Placed This Encounter  Procedures   Wet prep, genital   US OB LESS THAN 14 WEEKS WITH OB TRANSVAGINAL   Urinalysis, Routine w reflex microscopic -Urine, Clean Catch   CBC   hCG, quantitative, pregnancy   Diet NPO time specified   Pregnancy, urine POC   ABO/Rh   Meds ordered this encounter  Medications   acetaminophen-caffeine (EXCEDRIN TENSION HEADACHE) 500-65 MG per tablet 2 tablet   Results for orders placed or performed during the hospital encounter of 03/03/24 (from the past 48 hours)  Pregnancy, urine POC     Status: Abnormal   Collection Time: 03/03/24  5:37 PM  Result Value Ref Range   Preg Test, Ur POSITIVE (A) NEGATIVE    Comment:        THE SENSITIVITY OF THIS METHODOLOGY IS >24 mIU/mL   Urinalysis, Routine w reflex microscopic -Urine, Clean Catch     Status: Abnormal   Collection Time: 03/03/24  5:40 PM  Result Value Ref Range   Color, Urine YELLOW YELLOW   APPearance HAZY (A) CLEAR   Specific Gravity, Urine 1.027 1.005 - 1.030   pH 6.0 5.0 - 8.0   Glucose, UA NEGATIVE NEGATIVE mg/dL   Hgb urine dipstick NEGATIVE NEGATIVE   Bilirubin Urine NEGATIVE NEGATIVE   Ketones, ur NEGATIVE NEGATIVE mg/dL   Protein, ur NEGATIVE NEGATIVE mg/dL   Nitrite  NEGATIVE NEGATIVE   Leukocytes,Ua NEGATIVE NEGATIVE    Comment: Performed at [facility], [address], [place], [place] [address]  Wet prep, genital     Status: Abnormal   Collection Time: 03/03/24  6:15 PM   Specimen: Vaginal  Result Value Ref Range   Yeast Wet Prep HPF POC NONE SEEN NONE SEEN   Trich, Wet Prep  NONE SEEN NONE SEEN   Clue Cells Wet Prep HPF POC NONE SEEN NONE SEEN   WBC, Wet Prep HPF POC >=10 (A) <10   Sperm NONE SEEN     Comment: Performed at [facility], [address] [place], [place] [address]  CBC     Status: Abnormal   Collection Time: 03/03/24  6:17 PM  Result Value Ref Range   WBC 6.0 4.0 - 10.5 K/uL   RBC 4.09 3.87 - 5.11 MIL/uL   Hemoglobin 11.5 (L) 12.0 - 15.0 g/dL   HCT 60.4 (L) 54.0 - 98.1 %   MCV 84.8 80.0 - 100.0 fL   MCH 28.1 26.0 - 34.0 pg   MCHC 33.1 30.0 - 36.0 g/dL   RDW 19.1 (H) 47.8 - 29.5 %   Platelets 247 150 - 400 K/uL   nRBC 0.0 0.0 - 0.2 %    Comment: Performed at [facility], [address], [place], [place] [address]  ABO/Rh     Status: None   Collection Time: 03/03/24  6:17 PM  Result Value Ref Range   ABO/RH(D) O POS    No rh immune globuloin      NOT A RH IMMUNE GLOBULIN CANDIDATE, PT RH POSITIVE Performed at [facility], [address], [place], [place] [address]   hCG, quantitative, pregnancy     Status: Abnormal   Collection Time: 03/03/24  6:17 PM  Result Value Ref Range   hCG, Beta Chain, Quant, S 58,486 (H) <5 mIU/mL    Comment:          GEST. AGE      CONC.  (mIU/mL)   <=1 WEEK        5 - 50     2 WEEKS       50 - 500     3 WEEKS       100 - 10,000     4 WEEKS     1,000 - 30,000     5 WEEKS     3,500 - 115,000   6-8 WEEKS     12,000 - 270,000    12 WEEKS     15,000 - 220,000        FEMALE AND NON-PREGNANT FEMALE:     LESS THAN 5 mIU/mL Performed at [facility], [address], [place], [place] [address]      MDM - Excedrin given for migraine  - HCG >58K - Hgb 11.5  mildly low, but can be a normal finding in pregnancy.  - Wet prep normal  - UA hazy otherwise normal  - [place] independently read by me. US revealed a single living IUP measuring ~[redacted] weeks gestation.  - plan for discharge.   Assessment and Plan   1. Abdominal cramping   2. [redacted] weeks gestation of pregnancy   3. Intractable migraine without status migrainosus, unspecified migraine type    - Reviewed that abdominal cramping and headaches can be a normal discomfort of pregnancy.  - Reviewed worsening signs and return precautions.  - Recommended that patient follow up with Prenatal care at an office of her choosing.  - Patient discharged home in stable condition and may return to MAU as needed.   Claudette Head, MSN CNM  03/03/2024, 6:23 PM

## 2024-03-06 LAB — GC/CHLAMYDIA PROBE AMP (~~LOC~~) NOT AT ARMC
Chlamydia: NEGATIVE
Comment: NEGATIVE
Comment: NORMAL
Neisseria Gonorrhea: NEGATIVE

## 2024-03-13 ENCOUNTER — Other Ambulatory Visit (HOSPITAL_COMMUNITY)
Admission: RE | Admit: 2024-03-13 | Discharge: 2024-03-13 | Disposition: A | Discharge status: Home / Self Care | Source: Ambulatory Visit | Attending: Certified Nurse Midwife | Admitting: Certified Nurse Midwife

## 2024-03-13 ENCOUNTER — Ambulatory Visit (INDEPENDENT_AMBULATORY_CARE_PROVIDER_SITE_OTHER): Payer: Self-pay | Admitting: *Deleted

## 2024-03-13 ENCOUNTER — Encounter (HOSPITAL_BASED_OUTPATIENT_CLINIC_OR_DEPARTMENT_OTHER): Payer: Self-pay | Admitting: Certified Nurse Midwife

## 2024-03-13 ENCOUNTER — Ambulatory Visit (HOSPITAL_BASED_OUTPATIENT_CLINIC_OR_DEPARTMENT_OTHER): Admitting: Certified Nurse Midwife

## 2024-03-13 ENCOUNTER — Encounter (HOSPITAL_BASED_OUTPATIENT_CLINIC_OR_DEPARTMENT_OTHER): Payer: Self-pay

## 2024-03-13 VITALS — BP 102/72 | HR 77 | Wt 132.8 lb

## 2024-03-13 DIAGNOSIS — Z8632 Personal history of gestational diabetes: Secondary | ICD-10-CM | POA: Insufficient documentation

## 2024-03-13 DIAGNOSIS — Z3A14 14 weeks gestation of pregnancy: Secondary | ICD-10-CM | POA: Diagnosis not present

## 2024-03-13 DIAGNOSIS — Z3480 Encounter for supervision of other normal pregnancy, unspecified trimester: Secondary | ICD-10-CM | POA: Insufficient documentation

## 2024-03-13 DIAGNOSIS — Z3482 Encounter for supervision of other normal pregnancy, second trimester: Secondary | ICD-10-CM

## 2024-03-13 DIAGNOSIS — Z124 Encounter for screening for malignant neoplasm of cervix: Secondary | ICD-10-CM

## 2024-03-13 MED ORDER — BLOOD PRESSURE KIT DEVI: 1.0000 | Freq: Once | 0 refills | Status: AC

## 2024-03-13 MED ORDER — BLOOD PRESSURE KIT DEVI: 1.0000 | Freq: Once | 0 refills | Status: DC

## 2024-03-13 NOTE — Progress Notes (Signed)
 Clinical Support   03/13/24   GA: [redacted]w[redacted]d  Yolanda Hence, CNM  03/13/2024  9:57 AM Sign when Signing Visit Expand All Collapse All      INITIAL PRENATAL VISIT   Subjective:    Sarah Bond is being seen today for her first obstetrical visit. Accompanied by her supportive significant other. This is their 3rd child together. Oldest son is 5 and attends Pre-K at [employer]. Youngest "Jiovanni" is 14 months and healthy. He was born with a solitary kidney.    This is a planned pregnancy. This is a desired pregnancy.  She is at [redacted]w[redacted]d gestation by LMP.  Her obstetrical history is significant for  GDM with 2nd pregnancy . Relationship with FOB: significant other, living together. Patient does not intend to breast feed. Pregnancy history fully reviewed.   Patient reports fatigue.   Indications for ASA therapy (per uptodate) One of the following: Previous pregnancy with preeclampsia, especially early onset and with an adverse outcome No Multifetal gestation No Chronic hypertension No Type 1 or 2 diabetes mellitus No Chronic kidney disease No Autoimmune disease (antiphospholipid syndrome, systemic lupus erythematosus) No   Two or more of the following: Nulliparity No Obesity (body mass index >30 kg/m2) No Family history of preeclampsia in mother or sister No Age >=35 years No Sociodemographic characteristics (African American race, low socioeconomic level) Yes Personal risk factors (eg, previous pregnancy with low birth weight or small for gestational age infant, previous adverse pregnancy outcome [eg, stillbirth], interval >10 years between pregnancies) No     Review of Systems:    Review of Systems   Objective:     Obstetric History                  OB History  Gravida Para Term Preterm AB Living   3 2 2  0 0 2   SAB IAB Ectopic Multiple Live Births      0 0 0 0 2         # Outcome Date GA Lbr Len/2nd Weight Sex Type Anes PTL Lv  3 Current                    2 Term 01/01/23  [redacted]w[redacted]d   9 lb 6 oz (4.252 kg) M Vag-Spont EPI N LIV  1 Term 09/06/18 [redacted]w[redacted]d 10:41 / 00:20 8 lb 11.3 oz (3.949 kg) M Vag-Spont EPI   LIV          Past Medical History:  Diagnosis Date   Anemia      iron infusion during pregnancy   Depression     Generalized anxiety disorder 03/21/2015   Major depression, single episode 03/21/2015   Multicystic dysplastic kidney, fetal, affecting care of mother, 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    08/11/22: 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, with multiple cysts)     08/2022 MFM Recs:  -Follow-up ultrasound in 4 weeks to complete fetal anatomy and reevaluate fetal kidneys  -Serial interval fetal growth and to follow-up ultrasound to monitor fetal kidneys  -An appointment with pediatric urologist in third trimester     Suicide attempt (HCC) 03/21/2015               Past Surgical History:  Procedure Laterality Date   I & D EXTREMITY Left 08/08/2015    Procedure: IRRIGATION AND DEBRIDEMENT LEFT THUMB WITH REPAIR AND RECONSTRUCTION AS NEEDED;  Surgeon: Ronn Cohn, MD;  Location: [facility] OR;  Service: Orthopedics;  Laterality: Left;  Medications Ordered Prior to Encounter        Current Outpatient Medications on File Prior to Visit  Medication Sig Dispense Refill   Prenatal Vit-Fe Fumarate-FA (MULTIVITAMIN-PRENATAL) 27-0.8 MG TABS tablet Take 1 tablet by mouth daily at 12 noon.        No current facility-administered medications on file prior to visit.        Allergies  No Known Allergies     Social History:  reports that she has never smoked. She has never used smokeless tobacco. She reports that she does not drink alcohol and does not use drugs.        Family History  Problem Relation Age of Onset   Diabetes Mother     Diabetes Sister     Diabetes Maternal Grandmother     Diabetes Maternal Grandfather            The following portions of the patient's history were reviewed and updated as appropriate: allergies, current medications, past  family history, past medical history, past social history, past surgical history and problem list.   Review of Systems Review of Systems      Physical Exam:  LMP  (LMP Unknown)   Breastfeeding No  CONSTITUTIONAL: Well-developed, well-nourished female in no acute distress.  HENT:  Normocephalic, atraumatic.  Oropharynx is clear and moist EYES: Conjunctivae normal. No scleral icterus.  NECK: Normal range of motion, supple, no masses.  Normal thyroid.  SKIN: Skin is warm and dry. No rash noted. Not diaphoretic. No erythema. No pallor. MUSCULOSKELETAL: Normal range of motion. No tenderness.  No cyanosis, clubbing, or edema.   NEUROLOGIC: Alert and oriented to person, place, and time. Normal muscle tone coordination.  PSYCHIATRIC: Normal mood and affect. Normal behavior. Normal judgment and thought content. CARDIOVASCULAR: Normal heart rate noted, regular rhythm RESPIRATORY: Clear to auscultation bilaterally. Effort and breath sounds normal, no problems with respiration noted. BREASTS: Symmetric in size. No masses, skin changes, nipple drainage, or lymphadenopathy. ABDOMEN: Soft, normal bowel sounds, no distention noted.  No tenderness, rebound or guarding.  PELVIC: Normal appearing external genitalia; normal appearing vaginal mucosa and cervix.  No abnormal discharge noted.  Pap smear obtained.  Normal uterine size, no other palpable masses, no uterine or adnexal tenderness.                Assessment:     Pregnancy: G3P2002  1. Encounter for supervision of other normal pregnancy in second trimester (Primary) - Prenatal vitamin daily - Antibody screen - CBC - Hepatitis B surface antigen - HIV Antibody (routine testing w rflx) - HIV (Save tube for possible reflex) - RPR - Rubella screen - Hepatitis C antibody - Cytology - PAP( [place]) - Urine Culture - Hemoglobin A1c - PANORAMA PRENATAL TEST - Babyscripts Schedule Optimization - US  MFM OB DETAIL +14 WK; Future - HSV 1 and  2 Ab, IgG   Plan:      Initial labs drawn. Prenatal vitamins. Problem list reviewed and updated. Reviewed in detail the nature of the practice with collaborative care between  Genetic screening discussed: NIPS/First trimester screen/Quad/AFP requested. Role of ultrasound in pregnancy discussed; Anatomy US : requested. Amniocentesis discussed: not indicated. Follow up in 4 weeks. Weight gain recommendations per IOM guidelines reviewed: underweight/BMI 18.5 or less > 28 - 40 lbs; normal weight/BMI 18.5 - 24.9 > 25 - 35 lbs; overweight/BMI 25 - 29.9 > 15 - 25 lbs; obese/BMI  30 or more > 11 - 20 lbs.  Discussed clinic routines,  schedule of care and testing, genetic screening options, involvement of students and residents under the direct supervision of APPs and doctors and presence of female providers. Pt verbalized understanding.     Yolanda Hence, CNM 03/13/2024 9:54 AM

## 2024-03-13 NOTE — Progress Notes (Signed)
 INITIAL PRENATAL VISIT  Subjective:   Sarah Bond is being seen today for her first obstetrical visit. Accompanied by her supportive significant other. This is their 3rd child together. Oldest son is 5 and attends Pre-K at [employer]. Youngest "Jiovanni" is 14 months and healthy. He was born with a solitary kidney.   This is a planned pregnancy. This is a desired pregnancy.  She is at [redacted]w[redacted]d gestation by LMP.  Her obstetrical history is significant for  GDM with 2nd pregnancy . Relationship with FOB: significant other, living together. Patient does not intend to breast feed. Pregnancy history fully reviewed.  Patient reports fatigue.  Indications for ASA therapy (per uptodate) One of the following: Previous pregnancy with preeclampsia, especially early onset and with an adverse outcome No Multifetal gestation No Chronic hypertension No Type 1 or 2 diabetes mellitus No Chronic kidney disease No Autoimmune disease (antiphospholipid syndrome, systemic lupus erythematosus) No  Two or more of the following: Nulliparity No Obesity (body mass index >30 kg/m2) No Family history of preeclampsia in mother or sister No Age >=35 years No Sociodemographic characteristics (African American race, low socioeconomic level) Yes Personal risk factors (eg, previous pregnancy with low birth weight or small for gestational age infant, previous adverse pregnancy outcome [eg, stillbirth], interval >10 years between pregnancies) No   Review of Systems:   Review of Systems  Objective:    Obstetric History OB History  Gravida Para Term Preterm AB Living  3 2 2  0 0 2  SAB IAB Ectopic Multiple Live Births  0 0 0 0 2    # Outcome Date GA Lbr Len/2nd Weight Sex Type Anes PTL Lv  3 Current           2 Term 01/01/23 [redacted]w[redacted]d  9 lb 6 oz (4.252 kg) M Vag-Spont EPI N LIV  1 Term 09/06/18 [redacted]w[redacted]d 10:41 / 00:20 8 lb 11.3 oz (3.949 kg) M Vag-Spont EPI  LIV    Past Medical History:  Diagnosis Date    Anemia    iron infusion during pregnancy   Depression    Generalized anxiety disorder 03/21/2015   Major depression, single episode 03/21/2015   Multicystic dysplastic kidney, fetal, affecting care of mother, 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   08/11/22: 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, with multiple cysts)     08/2022 MFM Recs:  -Follow-up ultrasound in 4 weeks to complete fetal anatomy and reevaluate fetal kidneys  -Serial interval fetal growth and to follow-up ultrasound to monitor fetal kidneys  -An appointment with pediatric urologist in third trimester     Suicide attempt (HCC) 03/21/2015    Past Surgical History:  Procedure Laterality Date   I & D EXTREMITY Left 08/08/2015   Procedure: IRRIGATION AND DEBRIDEMENT LEFT THUMB WITH REPAIR AND RECONSTRUCTION AS NEEDED;  Surgeon: Ronn Cohn, MD;  Location: [facility] OR;  Service: Orthopedics;  Laterality: Left;    Current Outpatient Medications on File Prior to Visit  Medication Sig Dispense Refill   Prenatal Vit-Fe Fumarate-FA (MULTIVITAMIN-PRENATAL) 27-0.8 MG TABS tablet Take 1 tablet by mouth daily at 12 noon.     No current facility-administered medications on file prior to visit.    No Known Allergies  Social History:  reports that she has never smoked. She has never used smokeless tobacco. She reports that she does not drink alcohol and does not use drugs.  Family History  Problem Relation Age of Onset   Diabetes Mother    Diabetes Sister    Diabetes Maternal  Grandmother    Diabetes Maternal Grandfather     The following portions of the patient's history were reviewed and updated as appropriate: allergies, current medications, past family history, past medical history, past social history, past surgical history and problem list.  Review of Systems Review of Systems    Physical Exam:  LMP  (LMP Unknown)   Breastfeeding No  CONSTITUTIONAL: Well-developed, well-nourished female in no acute distress.  HENT:  Normocephalic,  atraumatic.  Oropharynx is clear and moist EYES: Conjunctivae normal. No scleral icterus.  NECK: Normal range of motion, supple, no masses.  Normal thyroid.  SKIN: Skin is warm and dry. No rash noted. Not diaphoretic. No erythema. No pallor. MUSCULOSKELETAL: Normal range of motion. No tenderness.  No cyanosis, clubbing, or edema.   NEUROLOGIC: Alert and oriented to person, place, and time. Normal muscle tone coordination.  PSYCHIATRIC: Normal mood and affect. Normal behavior. Normal judgment and thought content. CARDIOVASCULAR: Normal heart rate noted, regular rhythm RESPIRATORY: Clear to auscultation bilaterally. Effort and breath sounds normal, no problems with respiration noted. BREASTS: Symmetric in size. No masses, skin changes, nipple drainage, or lymphadenopathy. ABDOMEN: Soft, normal bowel sounds, no distention noted.  No tenderness, rebound or guarding.  PELVIC: Normal appearing external genitalia; normal appearing vaginal mucosa and cervix.  No abnormal discharge noted.  Pap smear obtained.  Normal uterine size, no other palpable masses, no uterine or adnexal tenderness.              Assessment:    Pregnancy: G3P2002  1. Encounter for supervision of other normal pregnancy in second trimester (Primary) - Prenatal vitamin daily - Antibody screen - CBC - Hepatitis B surface antigen - HIV Antibody (routine testing w rflx) - HIV (Save tube for possible reflex) - RPR - Rubella screen - Hepatitis C antibody - Cytology - PAP( [place]) - Urine Culture - Hemoglobin A1c - PANORAMA PRENATAL TEST - Babyscripts Schedule Optimization - US  MFM OB DETAIL +14 WK; Future - HSV 1 and 2 Ab, IgG  Plan:     Initial labs drawn. Prenatal vitamins. Problem list reviewed and updated. Reviewed in detail the nature of the practice with collaborative care between  Genetic screening discussed: NIPS/First trimester screen/Quad/AFP requested. Role of ultrasound in pregnancy discussed;  Anatomy US : requested. Amniocentesis discussed: not indicated. Follow up in 4 weeks. Weight gain recommendations per IOM guidelines reviewed: underweight/BMI 18.5 or less > 28 - 40 lbs; normal weight/BMI 18.5 - 24.9 > 25 - 35 lbs; overweight/BMI 25 - 29.9 > 15 - 25 lbs; obese/BMI  30 or more > 11 - 20 lbs.  Discussed clinic routines, schedule of care and testing, genetic screening options, involvement of students and residents under the direct supervision of APPs and doctors and presence of female providers. Pt verbalized understanding.   Yolanda Hence, CNM 03/13/2024 9:54 AM

## 2024-03-13 NOTE — Progress Notes (Signed)
 New OB Intake  I explained I am completing New OB Intake today. We discussed EDD of 09/05/2024, by Ultrasound. Pt is G3P2002. I reviewed her allergies, medications and Medical/Surgical/OB history.    Patient Active Problem List   Diagnosis Date Noted   Supervision of other normal pregnancy, antepartum 03/13/2024   History of gestational diabetes 03/13/2024     Concerns addressed today  Delivery Plans Plans to deliver at [facility]. Discussed the nature of our practice with multiple providers including residents and students. Due to the size of the practice, the delivering provider may not be the same as those providing prenatal care.   Patient is not interested in water birth.  MyChart/Babyscripts MyChart access verified. I explained pt will have some visits in office and some virtually. Babyscripts instructions given and order placed. Patient verifies receipt of registration text/e-mail. Account successfully created and app downloaded. If patient is a candidate for Optimized scheduling, add to sticky note.   Blood Pressure Cuff/Weight Scale Blood pressure cuff ordered for patient to pick-up from [employer]. Explained after first prenatal appt pt will check weekly and document in Babyscripts. Patient does have weight scale.  Anatomy US  Explained first scheduled US  will be around 19 weeks. Anatomy US  scheduled for 04/13/24 at 1300.  Is patient a CenteringPregnancy candidate?  Declined Declined due to Group setting  Is patient a Mom+Baby Combined Care candidate?  Declined   If accepted, confirm patient does not intend to move from the area for at least 12 months, then notify Mom+Baby staff  Is patient a candidate for Babyscripts Optimization? Yes, patient declined   First visit review I reviewed new OB appt with patient. Explained pt will be seen by Hezzie Loupe, CNM at first visit. Discussed [facility] [place] genetic screening with patient. Pt would like Panorama and would like to know  gender. Does not need Horizon. Routine prenatal labs  collected today    Last Pap Needs done  Marliss Simple, RN 03/13/2024  12:47 PM

## 2024-03-14 ENCOUNTER — Encounter (HOSPITAL_BASED_OUTPATIENT_CLINIC_OR_DEPARTMENT_OTHER): Payer: Self-pay | Admitting: Certified Nurse Midwife

## 2024-03-14 LAB — HEMOGLOBIN A1C
Est. average glucose Bld gHb Est-mCnc: 111 mg/dL
Hgb A1c MFr Bld: 5.5 % (ref 4.8–5.6)

## 2024-03-14 LAB — CBC
Hematocrit: 37.9 % (ref 34.0–46.6)
Hemoglobin: 12.2 g/dL (ref 11.1–15.9)
MCH: 28.5 pg (ref 26.6–33.0)
MCHC: 32.2 g/dL (ref 31.5–35.7)
MCV: 89 fL (ref 79–97)
Platelets: 239 10*3/uL (ref 150–450)
RBC: 4.28 x10E6/uL (ref 3.77–5.28)
RDW: 15.4 % (ref 11.7–15.4)
WBC: 6.2 10*3/uL (ref 3.4–10.8)

## 2024-03-14 LAB — HEPATITIS C ANTIBODY: Hep C Virus Ab: NONREACTIVE

## 2024-03-14 LAB — HEPATITIS B SURFACE ANTIGEN: Hepatitis B Surface Ag: NEGATIVE

## 2024-03-14 LAB — HSV 1 AND 2 AB, IGG
HSV 1 Glycoprotein G Ab, IgG: REACTIVE — AB
HSV 2 IgG, Type Spec: NONREACTIVE

## 2024-03-14 LAB — RPR: RPR Ser Ql: NONREACTIVE

## 2024-03-14 LAB — HIV ANTIBODY (ROUTINE TESTING W REFLEX): HIV Screen 4th Generation wRfx: NONREACTIVE

## 2024-03-14 LAB — ANTIBODY SCREEN: Antibody Screen: NEGATIVE

## 2024-03-14 LAB — RUBELLA SCREEN: Rubella Antibodies, IGG: <0.9 {index} — ABNORMAL LOW (ref >0.99)

## 2024-03-15 ENCOUNTER — Other Ambulatory Visit (HOSPITAL_BASED_OUTPATIENT_CLINIC_OR_DEPARTMENT_OTHER): Payer: Self-pay | Admitting: Certified Nurse Midwife

## 2024-03-15 DIAGNOSIS — Z3480 Encounter for supervision of other normal pregnancy, unspecified trimester: Secondary | ICD-10-CM

## 2024-03-15 DIAGNOSIS — Z2839 Other underimmunization status: Secondary | ICD-10-CM | POA: Insufficient documentation

## 2024-03-15 DIAGNOSIS — Z8632 Personal history of gestational diabetes: Secondary | ICD-10-CM

## 2024-03-15 LAB — URINE CULTURE

## 2024-03-17 LAB — CYTOLOGY - PAP
Comment: NEGATIVE
Diagnosis: UNDETERMINED — AB
High risk HPV: POSITIVE — AB

## 2024-03-17 LAB — PANORAMA PRENATAL TEST FULL PANEL:PANORAMA TEST PLUS 5 ADDITIONAL MICRODELETIONS: FETAL FRACTION: 7.7

## 2024-03-22 ENCOUNTER — Ambulatory Visit: Payer: Self-pay | Admitting: Family Medicine

## 2024-04-04 ENCOUNTER — Ambulatory Visit (HOSPITAL_BASED_OUTPATIENT_CLINIC_OR_DEPARTMENT_OTHER): Payer: Self-pay | Admitting: *Deleted

## 2024-04-06 ENCOUNTER — Ambulatory Visit (HOSPITAL_BASED_OUTPATIENT_CLINIC_OR_DEPARTMENT_OTHER): Payer: Self-pay | Admitting: Obstetrics & Gynecology

## 2024-04-06 VITALS — BP 102/68 | HR 80 | Wt 137.6 lb

## 2024-04-06 DIAGNOSIS — O09899 Supervision of other high risk pregnancies, unspecified trimester: Secondary | ICD-10-CM

## 2024-04-06 DIAGNOSIS — R894 Abnormal immunological findings in specimens from other organs, systems and tissues: Secondary | ICD-10-CM | POA: Diagnosis not present

## 2024-04-06 DIAGNOSIS — Z2839 Other underimmunization status: Secondary | ICD-10-CM

## 2024-04-06 DIAGNOSIS — Z3480 Encounter for supervision of other normal pregnancy, unspecified trimester: Secondary | ICD-10-CM

## 2024-04-06 DIAGNOSIS — Z3A18 18 weeks gestation of pregnancy: Secondary | ICD-10-CM

## 2024-04-06 DIAGNOSIS — O09892 Supervision of other high risk pregnancies, second trimester: Secondary | ICD-10-CM

## 2024-04-08 LAB — AFP, SERUM, OPEN SPINA BIFIDA
AFP MoM: 1.33
AFP Value: 63 ng/mL
Gest. Age on Collection Date: 18.2 wk
Maternal Age At EDD: 22.5 a
OSBR Risk 1 IN: 4399
Test Results:: NEGATIVE
Weight: 137 [lb_av]

## 2024-04-09 NOTE — Progress Notes (Signed)
   PRENATAL VISIT NOTE  Subjective:  Sarah Bond is a 23 y.o. G3P2002 at [redacted]w[redacted]d being seen today for ongoing prenatal care.  She is currently monitored for the following issues for this low-risk pregnancy and has Supervision of other normal pregnancy, antepartum; History of gestational diabetes; and Rubella non-immune status, antepartum on their problem list.  Patient reports no complaints.  Contractions: Not present. Vag. Bleeding: None.  Movement: Present. Denies leaking of fluid.   The following portions of the patient's history were reviewed and updated as appropriate: allergies, current medications, past family history, past medical history, past social history, past surgical history and problem list.   Objective:    Vitals:   04/06/24 1538  BP: 102/68  Pulse: 80  Weight: 137 lb 9.6 oz (62.4 kg)    Fetal Status:  Fetal Heart Rate (bpm): 150   Movement: Present    General: Alert, oriented and cooperative. Patient is in no acute distress.  Skin: Skin is warm and dry. No rash noted.   Cardiovascular: Normal heart rate noted  Respiratory: Normal respiratory effort, no problems with respiration noted  Abdomen: Soft, gravid, appropriate for gestational age.  Pain/Pressure: Absent     Pelvic: Cervical exam deferred        Extremities: Normal range of motion.  Edema: None  Mental Status: Normal mood and affect. Normal behavior. Normal judgment and thought content.   Assessment and Plan:  Pregnancy: G3P2002 at [redacted]w[redacted]d 1. [redacted] weeks gestation of pregnancy (Primary) - on PNV - AFP, Serum, Open Spina Bifida  2. Supervision of other normal pregnancy, antepartum - recheck 4 weekks - anatomy will be completed at anatomy ultrasound  3. Rubella non-immune status, antepartum  4. Positive test for herpes simplex virus (HSV) antibody - wiling to take treatment during last month of pregnancy  Preterm labor symptoms and general obstetric precautions including but not limited to  vaginal bleeding, contractions, leaking of fluid and fetal movement were reviewed in detail with the patient. Please refer to After Visit Summary for other counseling recommendations.   Return in about 4 weeks (around 05/04/2024).  Future Appointments  Date Time Provider Department Center  04/13/2024  1:00 PM [facility] PROVIDER 1 [facility]  04/13/2024  1:30 PM [facility] US5 [facility]  05/08/2024  3:15 PM Zelma Hidden, FNP [facility]  06/08/2024  8:55 AM Lo, Juvenal Opoka, CNM [facility]  06/27/2024  3:15 PM Lillian Rein, MD [facility]  07/11/2024  3:35 PM Lillian Rein, MD [facility]  07/25/2024  3:35 PM Lo, Juvenal Opoka, CNM [facility]  08/08/2024  3:15 PM Lo, Juvenal Opoka, CNM [facility]  08/15/2024  3:15 PM Lillian Rein, MD [facility]  08/22/2024  3:15 PM Hilda Lovings, Juvenal Opoka, CNM [facility]  08/29/2024  3:15 PM Lo, Juvenal Opoka, CNM [facility]  09/05/2024  3:15 PM Lillian Rein, MD [facility]    Lillian Rein, MD

## 2024-04-11 ENCOUNTER — Encounter (HOSPITAL_BASED_OUTPATIENT_CLINIC_OR_DEPARTMENT_OTHER): Payer: Self-pay | Admitting: Obstetrics & Gynecology

## 2024-04-12 ENCOUNTER — Ambulatory Visit (HOSPITAL_BASED_OUTPATIENT_CLINIC_OR_DEPARTMENT_OTHER): Payer: Self-pay | Admitting: Obstetrics & Gynecology

## 2024-04-12 DIAGNOSIS — Z3480 Encounter for supervision of other normal pregnancy, unspecified trimester: Secondary | ICD-10-CM

## 2024-04-13 ENCOUNTER — Ambulatory Visit (HOSPITAL_BASED_OUTPATIENT_CLINIC_OR_DEPARTMENT_OTHER): Admitting: Obstetrics

## 2024-04-13 ENCOUNTER — Ambulatory Visit: Attending: Certified Nurse Midwife

## 2024-04-13 ENCOUNTER — Other Ambulatory Visit: Payer: Self-pay | Admitting: *Deleted

## 2024-04-13 VITALS — BP 100/67 | HR 65

## 2024-04-13 DIAGNOSIS — Z8632 Personal history of gestational diabetes: Secondary | ICD-10-CM | POA: Insufficient documentation

## 2024-04-13 DIAGNOSIS — Z3A19 19 weeks gestation of pregnancy: Secondary | ICD-10-CM

## 2024-04-13 DIAGNOSIS — Z3480 Encounter for supervision of other normal pregnancy, unspecified trimester: Secondary | ICD-10-CM

## 2024-04-13 DIAGNOSIS — O09292 Supervision of pregnancy with other poor reproductive or obstetric history, second trimester: Secondary | ICD-10-CM

## 2024-04-13 DIAGNOSIS — O09892 Supervision of other high risk pregnancies, second trimester: Secondary | ICD-10-CM | POA: Insufficient documentation

## 2024-04-13 DIAGNOSIS — O98512 Other viral diseases complicating pregnancy, second trimester: Secondary | ICD-10-CM | POA: Diagnosis not present

## 2024-04-13 DIAGNOSIS — O24112 Pre-existing diabetes mellitus, type 2, in pregnancy, second trimester: Secondary | ICD-10-CM | POA: Diagnosis not present

## 2024-04-13 DIAGNOSIS — Z3482 Encounter for supervision of other normal pregnancy, second trimester: Secondary | ICD-10-CM | POA: Diagnosis present

## 2024-04-13 NOTE — Progress Notes (Signed)
 MFM Consult Note  Carianne Chavez Jaquez is currently at 19 weeks and 2 days.  She was seen due to a short interval pregnancy.    Her last child (she delivered at [facility]) was born with a multicystic dysplastic kidney.  She reports that her child is doing well today and has not required surgery for the multicystic dysplastic kidney.  The pediatric urologist is following the child with serial ultrasound exams to assess the multicystic dysplastic kidney.  She reports that the kidney is now decreasing in size.  She denies any problems in her current pregnancy.    She had a cell free DNA test earlier in her pregnancy which indicated a low risk for trisomy 84, 29, and 13. A female fetus is predicted.  She was informed that the fetal growth and amniotic fluid level were appropriate for her gestational age.   There were no obvious fetal anomalies noted on today's ultrasound exam.  The fetal kidneys appeared within normal limits today.  The patient was informed that anomalies may be missed due to technical limitations. If the fetus is in a suboptimal position or maternal habitus is increased, visualization of the fetus in the maternal uterus may be impaired.  Due to her short interval pregnancy, a follow-up growth scan was scheduled in the third trimester (in 10 weeks).    The patient stated that all of her questions were answered today.  A total of 30 minutes was spent counseling and coordinating the care for this patient.  Greater than 50% of the time was spent in direct face-to-face contact.

## 2024-05-08 ENCOUNTER — Ambulatory Visit (HOSPITAL_BASED_OUTPATIENT_CLINIC_OR_DEPARTMENT_OTHER): Payer: Self-pay | Admitting: Obstetrics and Gynecology

## 2024-05-08 ENCOUNTER — Encounter (HOSPITAL_BASED_OUTPATIENT_CLINIC_OR_DEPARTMENT_OTHER): Payer: Self-pay | Admitting: Obstetrics and Gynecology

## 2024-05-08 VITALS — BP 102/73 | HR 100 | Wt 145.2 lb

## 2024-05-08 DIAGNOSIS — Z3A22 22 weeks gestation of pregnancy: Secondary | ICD-10-CM

## 2024-05-08 DIAGNOSIS — R894 Abnormal immunological findings in specimens from other organs, systems and tissues: Secondary | ICD-10-CM | POA: Diagnosis not present

## 2024-05-08 DIAGNOSIS — O09892 Supervision of other high risk pregnancies, second trimester: Secondary | ICD-10-CM | POA: Diagnosis not present

## 2024-05-08 DIAGNOSIS — Z2839 Other underimmunization status: Secondary | ICD-10-CM | POA: Diagnosis not present

## 2024-05-08 DIAGNOSIS — Z3480 Encounter for supervision of other normal pregnancy, unspecified trimester: Secondary | ICD-10-CM

## 2024-05-08 NOTE — Progress Notes (Signed)
   PRENATAL VISIT NOTE  Subjective:  Sarah Bond is a 23 y.o. G3P2002 at [redacted]w[redacted]d being seen today for ongoing prenatal care.  She is currently monitored for the following issues for this low-risk pregnancy and has Supervision of other normal pregnancy, antepartum; History of gestational diabetes; and Rubella non-immune status, antepartum on their problem list.  Patient reports no complaints.  Contractions: Not present. Vag. Bleeding: None.  Movement: Present. Denies leaking of fluid.   The following portions of the patient's history were reviewed and updated as appropriate: allergies, current medications, past family history, past medical history, past social history, past surgical history and problem list.   Objective:    Vitals:   05/08/24 1520  BP: 102/73  Pulse: 100  Weight: 145 lb 3.2 oz (65.9 kg)    Fetal Status:  Fetal Heart Rate (bpm): 147 Fundal Height: 22 cm Movement: Present    General: Alert, oriented and cooperative. Patient is in no acute distress.  Skin: Skin is warm and dry. No rash noted.   Cardiovascular: Normal heart rate noted  Respiratory: Normal respiratory effort, no problems with respiration noted  Abdomen: Soft, gravid, appropriate for gestational age.  Pain/Pressure: Absent     Pelvic: Cervical exam deferred        Extremities: Normal range of motion.  Edema: Trace  Mental Status: Normal mood and affect. Normal behavior. Normal judgment and thought content.   Assessment and Plan:  Pregnancy: G3P2002 at [redacted]w[redacted]d 1. Supervision of other normal pregnancy, antepartum (Primary) BP and FHR normal Doing well, feeling regular movement    2. [redacted] weeks gestation of pregnancy Anticipatory guidance regarding GTT and labs next visit, discussed NPO status after midnight  Follow up u/s ot complete anatomy 7/31 Briefly discussed options for birth control  3. Rubella non-immune status, antepartum  4. Positive test for herpes simplex virus (HSV) antibody Ppx 36  wks   Preterm labor symptoms and general obstetric precautions including but not limited to vaginal bleeding, contractions, leaking of fluid and fetal movement were reviewed in detail with the patient. Please refer to After Visit Summary for other counseling recommendations.   Return in 4 weeks for LOB and 2hr GTT  Future Appointments  Date Time Provider Department Center  06/08/2024  8:55 AM Lo, Juvenal Opoka, CNM [facility]  06/22/2024  8:00 AM [facility] PROVIDER 1 [facility]  06/22/2024  8:30 AM [facility] US2 [facility]  06/27/2024  3:15 PM Lo, Juvenal Opoka, CNM [facility]  07/11/2024  3:35 PM Lillian Rein, MD [facility]  07/25/2024  3:35 PM Lo, Juvenal Opoka, CNM [facility]  08/08/2024  3:15 PM Lo, Juvenal Opoka, CNM [facility]  08/15/2024  3:15 PM Lillian Rein, MD [facility]  08/22/2024  3:15 PM Lo, Juvenal Opoka, CNM [facility]  08/29/2024  3:15 PM Lo, Juvenal Opoka, CNM [facility]  09/05/2024  3:15 PM Lillian Rein, MD [facility]    Susi Eric, FNP

## 2024-06-06 ENCOUNTER — Other Ambulatory Visit (HOSPITAL_BASED_OUTPATIENT_CLINIC_OR_DEPARTMENT_OTHER): Payer: Self-pay | Admitting: Certified Nurse Midwife

## 2024-06-06 ENCOUNTER — Encounter (HOSPITAL_BASED_OUTPATIENT_CLINIC_OR_DEPARTMENT_OTHER): Payer: Self-pay | Admitting: Certified Nurse Midwife

## 2024-06-06 DIAGNOSIS — Z8632 Personal history of gestational diabetes: Secondary | ICD-10-CM

## 2024-06-06 DIAGNOSIS — Z841 Family history of disorders of kidney and ureter: Secondary | ICD-10-CM | POA: Insufficient documentation

## 2024-06-06 DIAGNOSIS — Z3480 Encounter for supervision of other normal pregnancy, unspecified trimester: Secondary | ICD-10-CM

## 2024-06-06 DIAGNOSIS — Z2839 Other underimmunization status: Secondary | ICD-10-CM

## 2024-06-08 ENCOUNTER — Ambulatory Visit (INDEPENDENT_AMBULATORY_CARE_PROVIDER_SITE_OTHER): Payer: Self-pay | Admitting: Certified Nurse Midwife

## 2024-06-08 ENCOUNTER — Other Ambulatory Visit (HOSPITAL_BASED_OUTPATIENT_CLINIC_OR_DEPARTMENT_OTHER)

## 2024-06-08 VITALS — BP 94/66 | HR 94 | Wt 151.6 lb

## 2024-06-08 DIAGNOSIS — Z2839 Other underimmunization status: Secondary | ICD-10-CM

## 2024-06-08 DIAGNOSIS — O09892 Supervision of other high risk pregnancies, second trimester: Secondary | ICD-10-CM | POA: Diagnosis not present

## 2024-06-08 DIAGNOSIS — Z841 Family history of disorders of kidney and ureter: Secondary | ICD-10-CM

## 2024-06-08 DIAGNOSIS — O09899 Supervision of other high risk pregnancies, unspecified trimester: Secondary | ICD-10-CM | POA: Insufficient documentation

## 2024-06-08 DIAGNOSIS — Z3480 Encounter for supervision of other normal pregnancy, unspecified trimester: Secondary | ICD-10-CM

## 2024-06-08 DIAGNOSIS — B009 Herpesviral infection, unspecified: Secondary | ICD-10-CM | POA: Diagnosis not present

## 2024-06-08 DIAGNOSIS — Z3A27 27 weeks gestation of pregnancy: Secondary | ICD-10-CM | POA: Diagnosis not present

## 2024-06-08 DIAGNOSIS — O352XX0 Maternal care for (suspected) hereditary disease in fetus, not applicable or unspecified: Secondary | ICD-10-CM | POA: Insufficient documentation

## 2024-06-08 NOTE — Progress Notes (Signed)
      PRENATAL VISIT NOTE  Subjective:  Sarah Bond is a 23 y.o. G3P2002 at [redacted]w[redacted]d being seen today for ongoing prenatal care.  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  Patient reports no complaints. Denies leaking of fluid.   The following portions of the patient's history were reviewed and updated as appropriate: allergies, current medications, past family history, past medical history, past social history, past surgical history and problem list.   Objective:    Vitals:   06/08/24 0909  BP: 94/66  Pulse: 94  Weight: 151 lb 9.6 oz (68.8 kg)    Fetal Status:      Movement: Present    General: Alert, oriented and cooperative. Patient is in no acute distress.  Skin: Skin is warm and dry. No rash noted.   Cardiovascular: Normal heart rate noted  Respiratory: Normal respiratory effort, no problems with respiration noted  Abdomen: Soft, gravid, appropriate for gestational age.  Pain/Pressure: Absent     Pelvic: Cervical exam deferred        Extremities: Normal range of motion.  Edema: None  Mental Status: Normal mood and affect. Normal behavior. Normal judgment and thought content.   Assessment and Plan:  Pregnancy: G3P2002 at [redacted]w[redacted]d  1. Supervision of other normal pregnancy, antepartum (Primary) - Panorama Low-Risk, Female - AFP negative - US  (04/13/24): Vtx, posterior placenta, AFV wnl, CL 3.1cm, fetal anatomy scan complete, EFW 10oz (52%), due to short interval pregnancy,  follow-up US  growth in 3rd Trimester (10 weeks).   2. [redacted] weeks gestation of pregnancy - Labs pending including 2hr GTT  3. 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 Short interval between pregnancies affecting pregnancy, antepartum - Pt doing well, has gained 16lb at 27w2    5. HSV-1 (herpes simplex virus 1) infection - Start Valtrex 500mg  po BID at 36wk    Preterm labor symptoms and general obstetric precautions including but not limited to vaginal bleeding, contractions, leaking of fluid and fetal movement were reviewed in detail with the patient. Please refer to After Visit Summary for other counseling recommendations.   No follow-ups on file.  Future Appointments  Date Time Provider Department Center  06/22/2024  8:00 AM [facility] PROVIDER 1 [facility]  06/22/2024  8:30 AM [facility] US2 [facility]  06/27/2024  3:15 PM Chandria Rookstool, Arland POUR, CNM [facility]  07/11/2024  3:35 PM Cleotilde Ronal RAMAN, MD [facility]  07/25/2024  3:35 PM Paloma Grange, Arland POUR, CNM [facility]  08/08/2024  3:15 PM Aragorn Recker, Arland POUR, CNM [facility]  08/15/2024  3:15 PM Cleotilde Ronal RAMAN, MD [facility]  08/22/2024  3:15 PM Indiya Izquierdo, Arland POUR, CNM [facility]  08/29/2024  3:15 PM Joselinne Lawal, Arland POUR, CNM [facility]  09/05/2024  3:15 PM Cleotilde Ronal RAMAN, MD [facility]    Arland POUR Roller, CNM

## 2024-06-09 ENCOUNTER — Ambulatory Visit (HOSPITAL_BASED_OUTPATIENT_CLINIC_OR_DEPARTMENT_OTHER): Payer: Self-pay | Admitting: Certified Nurse Midwife

## 2024-06-09 ENCOUNTER — Other Ambulatory Visit (HOSPITAL_BASED_OUTPATIENT_CLINIC_OR_DEPARTMENT_OTHER): Payer: Self-pay | Admitting: Certified Nurse Midwife

## 2024-06-09 DIAGNOSIS — Z3480 Encounter for supervision of other normal pregnancy, unspecified trimester: Secondary | ICD-10-CM

## 2024-06-09 DIAGNOSIS — O99013 Anemia complicating pregnancy, third trimester: Secondary | ICD-10-CM

## 2024-06-09 LAB — GLUCOSE TOLERANCE, 2 HOURS W/ 1HR
Glucose, 1 hour: 103 mg/dL (ref 70–179)
Glucose, 2 hour: 102 mg/dL (ref 70–152)
Glucose, Fasting: 80 mg/dL (ref 70–91)

## 2024-06-09 LAB — RPR: RPR Ser Ql: NONREACTIVE

## 2024-06-09 LAB — CBC
Hematocrit: 33.9 % — ABNORMAL LOW (ref 34.0–46.6)
Hemoglobin: 10.3 g/dL — ABNORMAL LOW (ref 11.1–15.9)
MCH: 26.4 pg — ABNORMAL LOW (ref 26.6–33.0)
MCHC: 30.4 g/dL — ABNORMAL LOW (ref 31.5–35.7)
MCV: 87 fL (ref 79–97)
Platelets: 286 x10E3/uL (ref 150–450)
RBC: 3.9 x10E6/uL (ref 3.77–5.28)
RDW: 14.6 % (ref 11.7–15.4)
WBC: 7.5 x10E3/uL (ref 3.4–10.8)

## 2024-06-09 LAB — HIV ANTIBODY (ROUTINE TESTING W REFLEX): HIV Screen 4th Generation wRfx: NONREACTIVE

## 2024-06-09 MED ORDER — ACCRUFER 30 MG PO CAPS: 1.0000 | ORAL_CAPSULE | ORAL | 10 refills | Status: DC

## 2024-06-22 ENCOUNTER — Other Ambulatory Visit

## 2024-06-22 ENCOUNTER — Ambulatory Visit: Attending: Obstetrics

## 2024-06-22 ENCOUNTER — Ambulatory Visit (HOSPITAL_BASED_OUTPATIENT_CLINIC_OR_DEPARTMENT_OTHER): Admitting: Maternal & Fetal Medicine

## 2024-06-22 VITALS — BP 107/61

## 2024-06-22 DIAGNOSIS — O09899 Supervision of other high risk pregnancies, unspecified trimester: Secondary | ICD-10-CM | POA: Insufficient documentation

## 2024-06-22 DIAGNOSIS — O352XX0 Maternal care for (suspected) hereditary disease in fetus, not applicable or unspecified: Secondary | ICD-10-CM | POA: Insufficient documentation

## 2024-06-22 DIAGNOSIS — O09293 Supervision of pregnancy with other poor reproductive or obstetric history, third trimester: Secondary | ICD-10-CM | POA: Insufficient documentation

## 2024-06-22 DIAGNOSIS — Z8632 Personal history of gestational diabetes: Secondary | ICD-10-CM | POA: Diagnosis present

## 2024-06-22 DIAGNOSIS — O09892 Supervision of other high risk pregnancies, second trimester: Secondary | ICD-10-CM | POA: Insufficient documentation

## 2024-06-22 DIAGNOSIS — O98513 Other viral diseases complicating pregnancy, third trimester: Secondary | ICD-10-CM | POA: Diagnosis not present

## 2024-06-22 DIAGNOSIS — Z841 Family history of disorders of kidney and ureter: Secondary | ICD-10-CM | POA: Insufficient documentation

## 2024-06-22 DIAGNOSIS — Z3480 Encounter for supervision of other normal pregnancy, unspecified trimester: Secondary | ICD-10-CM

## 2024-06-22 DIAGNOSIS — Z3A29 29 weeks gestation of pregnancy: Secondary | ICD-10-CM | POA: Diagnosis not present

## 2024-06-22 DIAGNOSIS — B009 Herpesviral infection, unspecified: Secondary | ICD-10-CM | POA: Diagnosis not present

## 2024-06-25 NOTE — Progress Notes (Signed)
 After review, MFM consult with provider is not indicated for today  Sarah Nathanel Pipe, MD 06/25/2024 2:06 PM  [facility]

## 2024-06-27 ENCOUNTER — Encounter (HOSPITAL_BASED_OUTPATIENT_CLINIC_OR_DEPARTMENT_OTHER): Payer: Self-pay | Admitting: Certified Nurse Midwife

## 2024-07-11 ENCOUNTER — Ambulatory Visit (HOSPITAL_BASED_OUTPATIENT_CLINIC_OR_DEPARTMENT_OTHER): Payer: Self-pay | Admitting: Obstetrics & Gynecology

## 2024-07-11 VITALS — BP 97/62 | HR 93 | Wt 160.0 lb

## 2024-07-11 DIAGNOSIS — O09293 Supervision of pregnancy with other poor reproductive or obstetric history, third trimester: Secondary | ICD-10-CM | POA: Diagnosis not present

## 2024-07-11 DIAGNOSIS — O98513 Other viral diseases complicating pregnancy, third trimester: Secondary | ICD-10-CM | POA: Diagnosis not present

## 2024-07-11 DIAGNOSIS — Z8632 Personal history of gestational diabetes: Secondary | ICD-10-CM

## 2024-07-11 DIAGNOSIS — Z3480 Encounter for supervision of other normal pregnancy, unspecified trimester: Secondary | ICD-10-CM

## 2024-07-11 DIAGNOSIS — Z23 Encounter for immunization: Secondary | ICD-10-CM | POA: Diagnosis not present

## 2024-07-11 DIAGNOSIS — O09899 Supervision of other high risk pregnancies, unspecified trimester: Secondary | ICD-10-CM

## 2024-07-11 DIAGNOSIS — O352XX0 Maternal care for (suspected) hereditary disease in fetus, not applicable or unspecified: Secondary | ICD-10-CM | POA: Diagnosis not present

## 2024-07-11 DIAGNOSIS — Z2839 Other underimmunization status: Secondary | ICD-10-CM

## 2024-07-11 DIAGNOSIS — B009 Herpesviral infection, unspecified: Secondary | ICD-10-CM | POA: Diagnosis not present

## 2024-07-11 DIAGNOSIS — Z3A32 32 weeks gestation of pregnancy: Secondary | ICD-10-CM

## 2024-07-11 MED ORDER — ACCRUFER 30 MG PO CAPS: 1.0000 | ORAL_CAPSULE | ORAL | 10 refills | Status: DC

## 2024-07-11 NOTE — Progress Notes (Signed)
   PRENATAL VISIT NOTE  Subjective:  Sarah Bond is a 24 y.o. G3P2002 at [redacted]w[redacted]d being seen today for ongoing prenatal care.  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  Patient reports no complaints.  Contractions: Not present.  .  Movement: Present. Denies leaking of fluid.   The following portions of the patient's history were reviewed and updated as appropriate: allergies, current medications, past family history, past medical history, past social history, past surgical history and problem list.   Objective:   Vitals:   07/11/24 1544  BP: 97/62  Pulse: 93  Weight: 160 lb (72.6 kg)    Fetal Status: Fetal Heart Rate (bpm): 160 Fundal Height: 33 cm  Movement: Present    General:  Alert, oriented and cooperative. Patient is in no acute distress.  Skin: Skin is warm and dry. No rash noted.   Cardiovascular: Normal heart rate noted  Respiratory: Normal respiratory effort, no problems with respiration noted  Abdomen: Soft, gravid, appropriate for gestational age.  Pain/Pressure: Absent     Pelvic: Cervical exam deferred        Extremities: Normal range of motion.  Edema: None  Mental Status: Normal mood and affect. Normal behavior. Normal judgment and thought content.   Assessment and Plan:  Pregnancy: G3P2002 at [redacted]w[redacted]d 1. Supervision of other normal pregnancy, antepartum (Primary) - on PNV - recheck 2 weeks  2. History of gestational diabetes - normal GTT this pregnancy  3. HSV-1 (herpes simplex virus 1) infection (No  history of any lesions) - discussed treatment with valtrex at 36 weeks  4. 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 Rubella non-immune status, antepartum  6. Short interval between pregnancies affecting pregnancy, antepartum  7. [redacted] weeks gestation of pregnancy  Preterm labor symptoms and general obstetric precautions including but not limited to vaginal bleeding, contractions, leaking of fluid and fetal movement were reviewed in detail with the patient. Please refer to After Visit Summary for other counseling recommendations.   Return in about 2 weeks (around 07/25/2024).  Future Appointments  Date Time Provider Department Center  07/25/2024  3:35 PM Lo, Arland POUR, CNM [facility]  08/08/2024  3:15 PM Lo, Arland POUR, CNM [facility]  08/15/2024  3:15 PM Cleotilde Ronal RAMAN, MD [facility]  08/22/2024  3:15 PM Tad, Arland POUR, CNM [facility]  08/29/2024  3:15 PM Tad, Arland POUR, CNM [facility]  09/05/2024  3:15 PM Cleotilde Ronal RAMAN, MD [facility]    Ronal RAMAN Cleotilde, MD

## 2024-07-25 ENCOUNTER — Encounter (HOSPITAL_BASED_OUTPATIENT_CLINIC_OR_DEPARTMENT_OTHER): Payer: Self-pay | Admitting: Certified Nurse Midwife

## 2024-08-08 ENCOUNTER — Ambulatory Visit (HOSPITAL_BASED_OUTPATIENT_CLINIC_OR_DEPARTMENT_OTHER): Payer: Self-pay | Admitting: Certified Nurse Midwife

## 2024-08-08 ENCOUNTER — Other Ambulatory Visit (HOSPITAL_COMMUNITY)
Admission: RE | Admit: 2024-08-08 | Discharge: 2024-08-08 | Disposition: A | Discharge status: Home / Self Care | Source: Ambulatory Visit | Attending: Certified Nurse Midwife | Admitting: Certified Nurse Midwife

## 2024-08-08 ENCOUNTER — Other Ambulatory Visit (HOSPITAL_BASED_OUTPATIENT_CLINIC_OR_DEPARTMENT_OTHER): Payer: Self-pay

## 2024-08-08 VITALS — BP 112/68 | HR 86 | Wt 166.6 lb

## 2024-08-08 DIAGNOSIS — Z3480 Encounter for supervision of other normal pregnancy, unspecified trimester: Secondary | ICD-10-CM | POA: Diagnosis present

## 2024-08-08 DIAGNOSIS — O352XX0 Maternal care for (suspected) hereditary disease in fetus, not applicable or unspecified: Secondary | ICD-10-CM

## 2024-08-08 DIAGNOSIS — Z2839 Other underimmunization status: Secondary | ICD-10-CM

## 2024-08-08 DIAGNOSIS — O98513 Other viral diseases complicating pregnancy, third trimester: Secondary | ICD-10-CM

## 2024-08-08 DIAGNOSIS — O09899 Supervision of other high risk pregnancies, unspecified trimester: Secondary | ICD-10-CM

## 2024-08-08 DIAGNOSIS — B009 Herpesviral infection, unspecified: Secondary | ICD-10-CM | POA: Diagnosis not present

## 2024-08-08 DIAGNOSIS — Z3A36 36 weeks gestation of pregnancy: Secondary | ICD-10-CM

## 2024-08-08 DIAGNOSIS — Z8632 Personal history of gestational diabetes: Secondary | ICD-10-CM

## 2024-08-08 LAB — CBC
Hematocrit: 28.9 % — ABNORMAL LOW (ref 34.0–46.6)
Hemoglobin: 9.2 g/dL — ABNORMAL LOW (ref 11.1–15.9)
MCH: 24.8 pg — ABNORMAL LOW (ref 26.6–33.0)
MCHC: 31.8 g/dL (ref 31.5–35.7)
MCV: 78 fL — ABNORMAL LOW (ref 79–97)
Platelets: 293 x10E3/uL (ref 150–450)
RBC: 3.71 x10E6/uL — ABNORMAL LOW (ref 3.77–5.28)
RDW: 15.8 % — ABNORMAL HIGH (ref 11.7–15.4)
WBC: 7.4 x10E3/uL (ref 3.4–10.8)

## 2024-08-08 MED ORDER — VALACYCLOVIR HCL 500 MG PO TABS: 500.0000 mg | ORAL_TABLET | Freq: Two times a day (BID) | ORAL | 1 refills | Status: DC

## 2024-08-08 MED ORDER — ABRYSVO 120 MCG/0.5ML IM SOLR
0.5000 mL | Freq: Once | INTRAMUSCULAR | 0 refills | Status: AC
  Filled 2024-08-08: qty 0.5, 1d supply, fill #0

## 2024-08-08 NOTE — Progress Notes (Signed)
   PRENATAL VISIT NOTE  Subjective:  Sarah Bond is a 23 y.o. G3P2002 at [redacted]w[redacted]d being seen today for ongoing prenatal care.  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  Patient reports no bleeding, no contractions, no cramping, and no leaking.  Contractions: Not present. Vag. Bleeding: None.  Movement: Present. Denies leaking of fluid.   The following portions of the patient's history were reviewed and updated as appropriate: allergies, current medications, past family history, past medical history, past social history, past surgical history and problem list.   Objective:    Vitals:   08/08/24 1539  BP: 112/68  Pulse: 86  Weight: 166 lb 9.6 oz (75.6 kg)    Fetal Status:  Fetal Heart Rate (bpm): 140 Fundal Height: 36 cm Movement: Present Presentation: Vertex  General: Alert, oriented and cooperative. Patient is in no acute distress.  Skin: Skin is warm and dry. No rash noted.   Cardiovascular: Normal heart rate noted  Respiratory: Normal respiratory effort, no problems with respiration noted  Abdomen: Soft, gravid, appropriate for gestational age.  Pain/Pressure: Absent     Pelvic: Cervical exam performed in the presence of a chaperone Dilation: Fingertip Effacement (%): 0    Extremities: Normal range of motion.  Edema: None  Mental Status: Normal mood and affect. Normal behavior. Normal judgment and thought content.   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) ,  currently pregnant, antepartum, not applicable or unspecified fetus (Primary)   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) - Pt will start Valtrex  500mg  po BID until delivery  History of gestational diabetes   Rubella non-immune status, antepartum   Short interval between pregnancies affecting pregnancy, antepartum - CBC  upervision of other normal pregnancy, antepartum - Declines Flu Vaccine - Pt plans RSV Vaccine today (pharmacy) - Culture, beta strep (group b only) - Cervicovaginal ancillary only( [place]) - CBC  Preterm labor symptoms and general obstetric precautions including but not limited to vaginal bleeding, contractions, leaking of fluid and fetal movement were reviewed in detail with the patient. Please refer to After Visit Summary for other counseling recommendations.   No follow-ups on file.  Future Appointments  Date Time Provider Department Center  08/15/2024  3:15 PM Cleotilde Ronal RAMAN, MD [facility] [id number]  08/22/2024  3:15 PM Tad, Arland POUR, CNM [facility] [address]  08/29/2024  3:15 PM Tad, Arland POUR, CNM [facility] [phone] [facility]  09/05/2024  3:15 PM Cleotilde Ronal RAMAN, MD [facility] [phone] [facility]    Arland POUR Tad, CNM

## 2024-08-09 LAB — CERVICOVAGINAL ANCILLARY ONLY
Chlamydia: NEGATIVE
Comment: NEGATIVE
Comment: NEGATIVE
Comment: NORMAL
Neisseria Gonorrhea: NEGATIVE
Trichomonas: NEGATIVE

## 2024-08-10 ENCOUNTER — Telehealth: Payer: Self-pay

## 2024-08-10 ENCOUNTER — Ambulatory Visit (HOSPITAL_BASED_OUTPATIENT_CLINIC_OR_DEPARTMENT_OTHER): Payer: Self-pay | Admitting: Certified Nurse Midwife

## 2024-08-10 DIAGNOSIS — O99013 Anemia complicating pregnancy, third trimester: Secondary | ICD-10-CM | POA: Insufficient documentation

## 2024-08-10 NOTE — Telephone Encounter (Signed)
 Arland, patient will be scheduled as soon as possible.  Auth Submission: NO AUTH NEEDED Site of care: Site of care: [facility] Payer: [employer] medicaid Medication & CPT/J Code(s) submitted: Venofer (Iron Sucrose) J1756 Diagnosis Code:  Route of submission (phone, fax, portal):  Phone # Fax # Auth type: Buy/Bill PB Units/visits requested: 200mg  x 5 doses Reference number:  Approval from: 08/10/24 to 11/09/24

## 2024-08-10 NOTE — Progress Notes (Signed)
 Kaitlyn, I think I ordered these correctly. Please check on Monday and make sure that the order went through correctly and the patient gets scheduled for IV Iron. Thank you.

## 2024-08-11 LAB — CULTURE, BETA STREP (GROUP B ONLY): Strep Gp B Culture: NEGATIVE

## 2024-08-14 ENCOUNTER — Other Ambulatory Visit (HOSPITAL_BASED_OUTPATIENT_CLINIC_OR_DEPARTMENT_OTHER): Payer: Self-pay | Admitting: Certified Nurse Midwife

## 2024-08-14 DIAGNOSIS — Z3480 Encounter for supervision of other normal pregnancy, unspecified trimester: Secondary | ICD-10-CM

## 2024-08-15 ENCOUNTER — Encounter (HOSPITAL_BASED_OUTPATIENT_CLINIC_OR_DEPARTMENT_OTHER): Payer: Self-pay | Admitting: Obstetrics & Gynecology

## 2024-08-21 ENCOUNTER — Ambulatory Visit

## 2024-08-21 ENCOUNTER — Encounter (HOSPITAL_BASED_OUTPATIENT_CLINIC_OR_DEPARTMENT_OTHER): Admitting: Obstetrics & Gynecology

## 2024-08-21 VITALS — BP 119/79 | HR 109 | Temp 98.2°F | Resp 16 | Ht 65.0 in | Wt 170.2 lb

## 2024-08-21 DIAGNOSIS — O99013 Anemia complicating pregnancy, third trimester: Secondary | ICD-10-CM | POA: Diagnosis not present

## 2024-08-21 DIAGNOSIS — Z3A37 37 weeks gestation of pregnancy: Secondary | ICD-10-CM | POA: Diagnosis not present

## 2024-08-21 DIAGNOSIS — D649 Anemia, unspecified: Secondary | ICD-10-CM

## 2024-08-21 MED ORDER — IRON SUCROSE 20 MG/ML IV SOLN
200.0000 mg | Freq: Once | INTRAVENOUS | Status: AC
  Administered 2024-08-21: 200 mg via INTRAVENOUS
  Filled 2024-08-21: qty 10

## 2024-08-21 NOTE — Progress Notes (Signed)
 Diagnosis: Acute Anemia  Provider:  Mannam, Praveen MD  Procedure: IV Push  IV Type: Peripheral, IV Location: R Antecubital  Venofer  (Iron  Sucrose), Dose: 200 mg  Post Infusion IV Care: Observation period completed and Peripheral IV Discontinued  Discharge: Condition: Good, Destination: Home . AVS Provided  Performed by:  Rocky FORBES Sar, RN

## 2024-08-22 ENCOUNTER — Ambulatory Visit (HOSPITAL_BASED_OUTPATIENT_CLINIC_OR_DEPARTMENT_OTHER): Payer: Self-pay | Admitting: Certified Nurse Midwife

## 2024-08-22 VITALS — BP 102/66 | HR 95 | Wt 171.0 lb

## 2024-08-22 DIAGNOSIS — O98513 Other viral diseases complicating pregnancy, third trimester: Secondary | ICD-10-CM | POA: Diagnosis not present

## 2024-08-22 DIAGNOSIS — B009 Herpesviral infection, unspecified: Secondary | ICD-10-CM

## 2024-08-22 DIAGNOSIS — O99013 Anemia complicating pregnancy, third trimester: Secondary | ICD-10-CM | POA: Diagnosis not present

## 2024-08-22 DIAGNOSIS — Z3480 Encounter for supervision of other normal pregnancy, unspecified trimester: Secondary | ICD-10-CM

## 2024-08-22 DIAGNOSIS — O352XX0 Maternal care for (suspected) hereditary disease in fetus, not applicable or unspecified: Secondary | ICD-10-CM | POA: Diagnosis not present

## 2024-08-22 DIAGNOSIS — Z8632 Personal history of gestational diabetes: Secondary | ICD-10-CM

## 2024-08-22 DIAGNOSIS — Z3A38 38 weeks gestation of pregnancy: Secondary | ICD-10-CM

## 2024-08-22 DIAGNOSIS — O09899 Supervision of other high risk pregnancies, unspecified trimester: Secondary | ICD-10-CM

## 2024-08-22 DIAGNOSIS — Z2839 Other underimmunization status: Secondary | ICD-10-CM

## 2024-08-22 NOTE — Progress Notes (Signed)
    PRENATAL VISIT NOTE  Subjective:  Sarah Bond is a 23 y.o. G3P2002 at [redacted]w[redacted]d being seen today for ongoing prenatal care.  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  Patient reports no complaints.  Contractions: Not present. Vag. Bleeding: None.  Movement: Present. Denies leaking of fluid.   The following portions of the patient's history were reviewed and updated as appropriate: allergies, current medications, past family history, past medical history, past social history, past surgical history and problem list.   Objective:    Vitals:   08/22/24 1513  BP: 102/66  Pulse: 95  Weight: 171 lb (77.6 kg)    Fetal Status:      Movement: Present    General: Alert, oriented and cooperative. Patient is in no acute distress.  Skin: Skin is warm and dry. No rash noted.   Cardiovascular: Normal heart rate noted  Respiratory: Normal respiratory effort, no problems with respiration noted  Abdomen: Soft, gravid, appropriate for gestational age.  Pain/Pressure: Absent     Pelvic: Cervical exam deferred        Extremities: Normal range of motion.  Edema: None  Mental Status: Normal mood and affect. Normal behavior. Normal judgment and thought content.   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) , currently pregnant, antepartum, not applicable or unspecified fetus     HSV-1 (herpes simplex virus 1) infection (No history of any  lesions) - Pt on  Valtrex  500mg  po BID until delivery   History of gestational diabetes    Rubella non-immune status, antepartum    Short interval between pregnancies affecting pregnancy, antepartum - CBC   Supervision of other normal pregnancy, antepartum - Declines Flu Vaccine - RSV Vaccine UTD - GBS Negative, GC/CT Negative  Anemia - Pt received IV Venofer (Iron) 08/21/24 - 2nd Infusion scheduled for 08/23/24 (2 of 5 scheduled)   Term labor symptoms and general obstetric precautions including but not limited to vaginal bleeding, contractions, leaking of fluid and fetal movement were reviewed in detail with the patient. Please refer to After Visit Summary for other counseling recommendations.   No follow-ups on file.  Future Appointments  Date Time Provider Department Center  08/23/2024 11:30 AM [facility] None  08/25/2024  2:30 PM [facility] None  08/28/2024  9:00 AM [facility] None  08/29/2024  3:15 PM Cornelius Schuitema, Arland POUR, CNM [facility] [id number]  08/30/2024  9:00 AM [facility] None  09/05/2024  3:15 PM Cleotilde Ronal RAMAN, MD [facility] [phone] [facility]    Arland POUR Roller, CNM

## 2024-08-23 ENCOUNTER — Ambulatory Visit

## 2024-08-23 VITALS — BP 96/68 | HR 96 | Temp 98.5°F | Resp 16 | Ht 65.0 in | Wt 171.4 lb

## 2024-08-23 DIAGNOSIS — Z3A38 38 weeks gestation of pregnancy: Secondary | ICD-10-CM

## 2024-08-23 DIAGNOSIS — D649 Anemia, unspecified: Secondary | ICD-10-CM | POA: Diagnosis not present

## 2024-08-23 DIAGNOSIS — O99013 Anemia complicating pregnancy, third trimester: Secondary | ICD-10-CM

## 2024-08-23 MED ORDER — IRON SUCROSE 20 MG/ML IV SOLN
200.0000 mg | Freq: Once | INTRAVENOUS | Status: AC
  Administered 2024-08-23: 200 mg via INTRAVENOUS
  Filled 2024-08-23: qty 10

## 2024-08-23 NOTE — Progress Notes (Signed)
 Diagnosis: Acute Anemia  Provider:  Chilton Greathouse MD  Procedure: IV Push  IV Type: Peripheral, IV Location: R Antecubital  Venofer (Iron Sucrose), Dose: 200 mg  Post Infusion IV Care: Observation period completed and Peripheral IV Discontinued  Discharge: Condition: Stable, Destination: Home . AVS Declined  Performed by:  Wyvonne Lenz, RN

## 2024-08-25 ENCOUNTER — Ambulatory Visit

## 2024-08-25 VITALS — BP 94/65 | HR 74 | Temp 98.8°F | Resp 18 | Ht 65.0 in | Wt 170.6 lb

## 2024-08-25 DIAGNOSIS — O99013 Anemia complicating pregnancy, third trimester: Secondary | ICD-10-CM

## 2024-08-25 DIAGNOSIS — Z3A38 38 weeks gestation of pregnancy: Secondary | ICD-10-CM | POA: Diagnosis not present

## 2024-08-25 DIAGNOSIS — D649 Anemia, unspecified: Secondary | ICD-10-CM | POA: Diagnosis not present

## 2024-08-25 MED ORDER — IRON SUCROSE 20 MG/ML IV SOLN
200.0000 mg | Freq: Once | INTRAVENOUS | Status: AC
  Administered 2024-08-25: 200 mg via INTRAVENOUS
  Filled 2024-08-25: qty 10

## 2024-08-25 NOTE — Progress Notes (Signed)
 Diagnosis: Iron  Deficiency Anemia  Provider:  Praveen Mannam MD  Procedure: IV Push  IV Type: Peripheral, IV Location: R Antecubital  Venofer  (Iron  Sucrose), Dose: 200 mg  Post Infusion IV Care: Patient declined observation and Peripheral IV Discontinued  Discharge: Condition: Good, Destination: Home . AVS Declined  Performed by:  Maximiano JONELLE Pouch, LPN

## 2024-08-28 ENCOUNTER — Ambulatory Visit (INDEPENDENT_AMBULATORY_CARE_PROVIDER_SITE_OTHER): Admitting: *Deleted

## 2024-08-28 VITALS — BP 103/70 | HR 72 | Temp 97.9°F | Resp 18 | Ht 65.0 in | Wt 172.4 lb

## 2024-08-28 DIAGNOSIS — D649 Anemia, unspecified: Secondary | ICD-10-CM

## 2024-08-28 DIAGNOSIS — O99013 Anemia complicating pregnancy, third trimester: Secondary | ICD-10-CM | POA: Diagnosis not present

## 2024-08-28 DIAGNOSIS — Z3A38 38 weeks gestation of pregnancy: Secondary | ICD-10-CM | POA: Diagnosis not present

## 2024-08-28 MED ORDER — IRON SUCROSE 20 MG/ML IV SOLN
200.0000 mg | Freq: Once | INTRAVENOUS | Status: AC
  Administered 2024-08-28: 200 mg via INTRAVENOUS
  Filled 2024-08-28: qty 10

## 2024-08-28 NOTE — Progress Notes (Signed)
 Diagnosis: Iron  Deficiency Anemia  Provider:  Mannam, Praveen MD  Procedure: IV Push  IV Type: Peripheral, IV Location: R Antecubital  Venofer  (Iron  Sucrose), Dose: 200 mg  Post Infusion IV Care: Patient declined observation  Discharge: Condition: Good, Destination: Home . AVS Declined  Performed by:  Mathew Therisa NOVAK, RN

## 2024-08-29 ENCOUNTER — Ambulatory Visit (HOSPITAL_BASED_OUTPATIENT_CLINIC_OR_DEPARTMENT_OTHER): Payer: Self-pay | Admitting: Certified Nurse Midwife

## 2024-08-29 VITALS — BP 110/74 | HR 88 | Wt 172.0 lb

## 2024-08-29 DIAGNOSIS — O352XX0 Maternal care for (suspected) hereditary disease in fetus, not applicable or unspecified: Secondary | ICD-10-CM

## 2024-08-29 DIAGNOSIS — Z3490 Encounter for supervision of normal pregnancy, unspecified, unspecified trimester: Secondary | ICD-10-CM

## 2024-08-29 DIAGNOSIS — B009 Herpesviral infection, unspecified: Secondary | ICD-10-CM

## 2024-08-29 DIAGNOSIS — Z3A39 39 weeks gestation of pregnancy: Secondary | ICD-10-CM

## 2024-08-29 DIAGNOSIS — Z8632 Personal history of gestational diabetes: Secondary | ICD-10-CM

## 2024-08-29 DIAGNOSIS — O09293 Supervision of pregnancy with other poor reproductive or obstetric history, third trimester: Secondary | ICD-10-CM | POA: Diagnosis not present

## 2024-08-29 DIAGNOSIS — O99013 Anemia complicating pregnancy, third trimester: Secondary | ICD-10-CM

## 2024-08-29 DIAGNOSIS — D649 Anemia, unspecified: Secondary | ICD-10-CM

## 2024-08-29 DIAGNOSIS — O98513 Other viral diseases complicating pregnancy, third trimester: Secondary | ICD-10-CM | POA: Diagnosis not present

## 2024-08-29 DIAGNOSIS — Z3480 Encounter for supervision of other normal pregnancy, unspecified trimester: Secondary | ICD-10-CM

## 2024-08-29 DIAGNOSIS — O09899 Supervision of other high risk pregnancies, unspecified trimester: Secondary | ICD-10-CM

## 2024-08-29 DIAGNOSIS — Z2839 Other underimmunization status: Secondary | ICD-10-CM

## 2024-08-29 NOTE — Progress Notes (Signed)
    PRENATAL VISIT NOTE  Subjective:  Sarah Bond is a 23 y.o. G3P2002 at [redacted]w[redacted]d being seen today for ongoing prenatal care.  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  Patient reports no complaints.  Contractions: Not present. Vag. Bleeding: None.  Movement: Present. Denies leaking of fluid.   The following portions of the patient's history were reviewed and updated as appropriate: allergies, current medications, past family history, past medical history, past social history, past surgical history and problem list.   Objective:    Vitals:   08/29/24 1518  BP: 110/74  Pulse: 88  Weight: 172 lb (78 kg)    Fetal Status:  Fetal Heart Rate (bpm): 140 Fundal Height: 38 cm Movement: Present    General: Alert, oriented and cooperative. Patient is in no acute distress.  Skin: Skin is warm and dry. No rash noted.   Cardiovascular: Normal heart rate noted  Respiratory: Normal respiratory effort, no problems with respiration noted  Abdomen: Soft, gravid, appropriate for gestational age.  Pain/Pressure: Present     Pelvic: Cervical exam performed in the presence of a chaperone        Extremities: Normal range of motion.  Edema: None  Mental Status: Normal mood and affect. Normal behavior. Normal judgment and thought content.   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) - Pt on  Valtrex  500mg  po BID until delivery   History of gestational diabetes - Passed 2hr GTT    Rubella non-immune status, antepartum    Short interval between pregnancies affecting pregnancy, antepartum   Supervision of other normal pregnancy, antepartum - Declines Flu Vaccine - RSV Vaccine UTD - GBS Negative, GC/CT Negative   Anemia - Pt received IV Venofer (Iron) 08/21/24 - 5th infusion scheduled (has received 4/5)    Term labor symptoms and general obstetric precautions including but not limited to vaginal bleeding, contractions, leaking of fluid and fetal movement were reviewed in detail with the patient. Please refer to After Visit Summary for other counseling recommendations.   No follow-ups on file.  Future Appointments  Date Time Provider Department Center  08/30/2024  9:00 AM [facility] None  09/05/2024  3:15 PM Cleotilde Ronal RAMAN, MD [facility] [phone] [facility]    Arland MARLA Roller, CNM

## 2024-08-30 ENCOUNTER — Other Ambulatory Visit: Payer: Self-pay

## 2024-08-30 ENCOUNTER — Encounter (HOSPITAL_COMMUNITY): Payer: Self-pay | Admitting: Obstetrics & Gynecology

## 2024-08-30 ENCOUNTER — Inpatient Hospital Stay (HOSPITAL_COMMUNITY)
Admission: AD | Admit: 2024-08-30 | Discharge: 2024-09-01 | DRG: 806 | Disposition: A | Discharge status: Home / Self Care | Attending: Obstetrics & Gynecology | Admitting: Obstetrics & Gynecology

## 2024-08-30 ENCOUNTER — Ambulatory Visit

## 2024-08-30 DIAGNOSIS — O9832 Other infections with a predominantly sexual mode of transmission complicating childbirth: Secondary | ICD-10-CM | POA: Diagnosis present

## 2024-08-30 DIAGNOSIS — O09899 Supervision of other high risk pregnancies, unspecified trimester: Secondary | ICD-10-CM

## 2024-08-30 DIAGNOSIS — O9902 Anemia complicating childbirth: Principal | ICD-10-CM | POA: Diagnosis present

## 2024-08-30 DIAGNOSIS — Z2839 Other underimmunization status: Secondary | ICD-10-CM

## 2024-08-30 DIAGNOSIS — Z3480 Encounter for supervision of other normal pregnancy, unspecified trimester: Secondary | ICD-10-CM

## 2024-08-30 DIAGNOSIS — Z833 Family history of diabetes mellitus: Secondary | ICD-10-CM

## 2024-08-30 DIAGNOSIS — Z3490 Encounter for supervision of normal pregnancy, unspecified, unspecified trimester: Principal | ICD-10-CM | POA: Diagnosis present

## 2024-08-30 DIAGNOSIS — B009 Herpesviral infection, unspecified: Secondary | ICD-10-CM | POA: Diagnosis present

## 2024-08-30 DIAGNOSIS — O99013 Anemia complicating pregnancy, third trimester: Secondary | ICD-10-CM | POA: Diagnosis present

## 2024-08-30 DIAGNOSIS — Z3A39 39 weeks gestation of pregnancy: Secondary | ICD-10-CM

## 2024-08-30 DIAGNOSIS — A6000 Herpesviral infection of urogenital system, unspecified: Secondary | ICD-10-CM | POA: Diagnosis present

## 2024-08-30 MED ORDER — IRON SUCROSE 20 MG/ML IV SOLN: 200.0000 mg | Freq: Once | INTRAVENOUS | Status: DC

## 2024-08-30 NOTE — MAU Note (Signed)
 Sarah Bond is a 23 y.o. at [redacted]w[redacted]d here in MAU reporting: ctx all day - closer together for the past hour and a half - 5-6 minutes. Denies VB or LOF. +FM   LMP: NA Onset of complaint: 2130 Pain score: 7 Vitals:   08/30/24 2306  BP: 105/70  Pulse: 98  Resp: 17  Temp: 98.8 F (37.1 C)  SpO2: 99%     FHT: 154  Lab orders placed from triage: labor eval

## 2024-08-31 ENCOUNTER — Inpatient Hospital Stay (HOSPITAL_COMMUNITY): Admitting: Anesthesiology

## 2024-08-31 ENCOUNTER — Encounter (HOSPITAL_COMMUNITY): Payer: Self-pay | Admitting: Obstetrics & Gynecology

## 2024-08-31 ENCOUNTER — Inpatient Hospital Stay (HOSPITAL_COMMUNITY)

## 2024-08-31 DIAGNOSIS — O352XX0 Maternal care for (suspected) hereditary disease in fetus, not applicable or unspecified: Secondary | ICD-10-CM

## 2024-08-31 DIAGNOSIS — O98513 Other viral diseases complicating pregnancy, third trimester: Secondary | ICD-10-CM | POA: Diagnosis not present

## 2024-08-31 DIAGNOSIS — Z3A39 39 weeks gestation of pregnancy: Secondary | ICD-10-CM | POA: Diagnosis not present

## 2024-08-31 DIAGNOSIS — O368330 Maternal care for abnormalities of the fetal heart rate or rhythm, third trimester, not applicable or unspecified: Secondary | ICD-10-CM | POA: Diagnosis not present

## 2024-08-31 DIAGNOSIS — Z3490 Encounter for supervision of normal pregnancy, unspecified, unspecified trimester: Principal | ICD-10-CM | POA: Diagnosis present

## 2024-08-31 DIAGNOSIS — O26893 Other specified pregnancy related conditions, third trimester: Secondary | ICD-10-CM | POA: Diagnosis present

## 2024-08-31 DIAGNOSIS — A6000 Herpesviral infection of urogenital system, unspecified: Secondary | ICD-10-CM | POA: Diagnosis present

## 2024-08-31 DIAGNOSIS — O9902 Anemia complicating childbirth: Secondary | ICD-10-CM | POA: Diagnosis present

## 2024-08-31 DIAGNOSIS — O9832 Other infections with a predominantly sexual mode of transmission complicating childbirth: Secondary | ICD-10-CM | POA: Diagnosis present

## 2024-08-31 DIAGNOSIS — B009 Herpesviral infection, unspecified: Secondary | ICD-10-CM | POA: Diagnosis not present

## 2024-08-31 DIAGNOSIS — Z833 Family history of diabetes mellitus: Secondary | ICD-10-CM | POA: Diagnosis not present

## 2024-08-31 LAB — CBC
HCT: 32.6 % — ABNORMAL LOW (ref 36.0–46.0)
Hemoglobin: 10.2 g/dL — ABNORMAL LOW (ref 12.0–15.0)
MCH: 25.6 pg — ABNORMAL LOW (ref 26.0–34.0)
MCHC: 31.3 g/dL (ref 30.0–36.0)
MCV: 81.9 fL (ref 80.0–100.0)
Platelets: 202 K/uL (ref 150–400)
RBC: 3.98 MIL/uL (ref 3.87–5.11)
RDW: 23.4 % — ABNORMAL HIGH (ref 11.5–15.5)
WBC: 13.4 K/uL — ABNORMAL HIGH (ref 4.0–10.5)
nRBC: 0 % (ref 0.0–0.2)

## 2024-08-31 LAB — TYPE AND SCREEN
ABO/RH(D): O POS
Antibody Screen: NEGATIVE

## 2024-08-31 LAB — RPR: RPR Ser Ql: NONREACTIVE

## 2024-08-31 LAB — HIV ANTIBODY (ROUTINE TESTING W REFLEX): HIV Screen 4th Generation wRfx: NONREACTIVE

## 2024-08-31 MED ORDER — DIPHENHYDRAMINE HCL 50 MG/ML IJ SOLN: 12.5000 mg | INTRAMUSCULAR | Status: DC | PRN

## 2024-08-31 MED ORDER — VALACYCLOVIR HCL 500 MG PO TABS: 500.0000 mg | ORAL_TABLET | Freq: Two times a day (BID) | ORAL | Status: DC

## 2024-08-31 MED ORDER — TETANUS-DIPHTH-ACELL PERTUSSIS 5-2-15.5 LF-MCG/0.5 IM SUSP
0.5000 mL | Freq: Once | INTRAMUSCULAR | Status: DC
Start: 2024-09-01 — End: 2024-08-31

## 2024-08-31 MED ORDER — ONDANSETRON HCL 4 MG PO TABS: 4.0000 mg | ORAL_TABLET | ORAL | Status: DC | PRN

## 2024-08-31 MED ORDER — ONDANSETRON HCL 4 MG/2ML IJ SOLN: 4.0000 mg | INTRAMUSCULAR | Status: DC | PRN

## 2024-08-31 MED ORDER — DIBUCAINE (PERIANAL) 1 % EX OINT: 1.0000 | TOPICAL_OINTMENT | CUTANEOUS | Status: DC | PRN

## 2024-08-31 MED ORDER — LACTATED RINGERS IV SOLN: 500.0000 mL | INTRAVENOUS | Status: DC | PRN

## 2024-08-31 MED ORDER — IBUPROFEN 600 MG PO TABS
600.0000 mg | ORAL_TABLET | Freq: Four times a day (QID) | ORAL | Status: DC
  Administered 2024-08-31 – 2024-09-01 (×5): 600 mg via ORAL
  Filled 2024-08-31 (×5): qty 1

## 2024-08-31 MED ORDER — TERBUTALINE SULFATE 1 MG/ML IJ SOLN: 0.2500 mg | Freq: Once | INTRAMUSCULAR | Status: DC | PRN

## 2024-08-31 MED ORDER — MEASLES, MUMPS & RUBELLA VAC ~~LOC~~ SUSR
0.5000 mL | Freq: Once | SUBCUTANEOUS | Status: AC
  Administered 2024-09-01: 0.5 mL via SUBCUTANEOUS
  Filled 2024-08-31: qty 0.5

## 2024-08-31 MED ORDER — SIMETHICONE 80 MG PO CHEW: 80.0000 mg | CHEWABLE_TABLET | ORAL | Status: DC | PRN

## 2024-08-31 MED ORDER — BENZOCAINE-MENTHOL 20-0.5 % EX AERO: 1.0000 | INHALATION_SPRAY | CUTANEOUS | Status: DC | PRN

## 2024-08-31 MED ORDER — ACETAMINOPHEN 500 MG PO TABS
1000.0000 mg | ORAL_TABLET | Freq: Four times a day (QID) | ORAL | Status: DC
  Administered 2024-08-31 – 2024-09-01 (×5): 1000 mg via ORAL
  Filled 2024-08-31 (×5): qty 2

## 2024-08-31 MED ORDER — MAGNESIUM HYDROXIDE 400 MG/5ML PO SUSP: 30.0000 mL | Freq: Every day | ORAL | Status: DC | PRN

## 2024-08-31 MED ORDER — COCONUT OIL OIL: 1.0000 | TOPICAL_OIL | Status: DC | PRN

## 2024-08-31 MED ORDER — OXYTOCIN-SODIUM CHLORIDE 30-0.9 UT/500ML-% IV SOLN
1.0000 m[IU]/min | INTRAVENOUS | Status: DC
  Filled 2024-08-31: qty 500

## 2024-08-31 MED ORDER — ONDANSETRON HCL 4 MG/2ML IJ SOLN: 4.0000 mg | Freq: Four times a day (QID) | INTRAMUSCULAR | Status: DC | PRN

## 2024-08-31 MED ORDER — LIDOCAINE HCL (PF) 1 % IJ SOLN
INTRAMUSCULAR | Status: DC | PRN
  Administered 2024-08-31 (×2): 4 mL via EPIDURAL

## 2024-08-31 MED ORDER — EPHEDRINE 5 MG/ML INJ: 10.0000 mg | INTRAVENOUS | Status: DC | PRN

## 2024-08-31 MED ORDER — OXYTOCIN BOLUS FROM INFUSION
333.0000 mL | Freq: Once | INTRAVENOUS | Status: AC
  Administered 2024-08-31: 333 mL via INTRAVENOUS

## 2024-08-31 MED ORDER — LIDOCAINE HCL (PF) 1 % IJ SOLN: 30.0000 mL | INTRAMUSCULAR | Status: DC | PRN

## 2024-08-31 MED ORDER — ACETAMINOPHEN 325 MG PO TABS: 650.0000 mg | ORAL_TABLET | ORAL | Status: DC | PRN

## 2024-08-31 MED ORDER — LACTATED RINGERS IV SOLN
500.0000 mL | Freq: Once | INTRAVENOUS | Status: AC
  Administered 2024-08-31: 500 mL via INTRAVENOUS

## 2024-08-31 MED ORDER — FENTANYL-BUPIVACAINE-NACL 0.5-0.125-0.9 MG/250ML-% EP SOLN
12.0000 mL/h | EPIDURAL | Status: DC | PRN
  Administered 2024-08-31: 12 mL/h via EPIDURAL
  Filled 2024-08-31: qty 250

## 2024-08-31 MED ORDER — PHENYLEPHRINE 80 MCG/ML (10ML) SYRINGE FOR IV PUSH (FOR BLOOD PRESSURE SUPPORT): 80.0000 ug | PREFILLED_SYRINGE | INTRAVENOUS | Status: DC | PRN

## 2024-08-31 MED ORDER — OXYTOCIN-SODIUM CHLORIDE 30-0.9 UT/500ML-% IV SOLN: 2.5000 [IU]/h | INTRAVENOUS | Status: DC

## 2024-08-31 MED ORDER — PHENYLEPHRINE 80 MCG/ML (10ML) SYRINGE FOR IV PUSH (FOR BLOOD PRESSURE SUPPORT)
80.0000 ug | PREFILLED_SYRINGE | INTRAVENOUS | Status: DC | PRN
  Filled 2024-08-31: qty 10

## 2024-08-31 MED ORDER — DIPHENHYDRAMINE HCL 25 MG PO CAPS: 25.0000 mg | ORAL_CAPSULE | Freq: Four times a day (QID) | ORAL | Status: DC | PRN

## 2024-08-31 MED ORDER — LACTATED RINGERS IV SOLN: INTRAVENOUS | Status: DC

## 2024-08-31 MED ORDER — PRENATAL MULTIVITAMIN CH
1.0000 | ORAL_TABLET | Freq: Every day | ORAL | Status: DC
Start: 2024-08-31 — End: 2024-09-01
  Administered 2024-08-31 – 2024-09-01 (×2): 1 via ORAL
  Filled 2024-08-31 (×2): qty 1

## 2024-08-31 MED ORDER — WITCH HAZEL-GLYCERIN EX PADS: 1.0000 | MEDICATED_PAD | CUTANEOUS | Status: DC | PRN

## 2024-08-31 MED ORDER — SOD CITRATE-CITRIC ACID 500-334 MG/5ML PO SOLN: 30.0000 mL | ORAL | Status: DC | PRN

## 2024-08-31 MED ORDER — SENNOSIDES-DOCUSATE SODIUM 8.6-50 MG PO TABS
2.0000 | ORAL_TABLET | Freq: Every day | ORAL | Status: DC
  Administered 2024-09-01: 2 via ORAL
  Filled 2024-08-31: qty 2

## 2024-08-31 MED ORDER — LACTATED RINGERS IV BOLUS
1000.0000 mL | Freq: Once | INTRAVENOUS | Status: AC
Start: 2024-08-31 — End: 2024-08-31
  Administered 2024-08-31: 1000 mL via INTRAVENOUS

## 2024-08-31 NOTE — Discharge Summary (Signed)
 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 yo [id number] at [redacted]w[redacted]d was admitted in Latent Labor on 08/30/2024. Labor course was uncomplicated.  Membrane Rupture Time/Date: 4:35 AM,08/31/2024  Delivery Method:Vaginal, Spontaneous Operative Delivery:N/A Episiotomy: None Lacerations:  None Patient had a postpartum course complicated by ***.  She is ambulating, tolerating a regular diet, passing flatus, and urinating well. Patient is discharged home in stable condition on 08/31/24.  Newborn Data: Birth date:08/31/2024 Birth time:7:40 AM Gender:Female Living status:Living Apgars:8 ,9  Weight:   Magnesium Sulfate received: {Mag received:30440022} BMZ received: No Rhophylac:No MMR:{MMR:30440033} RNI, recommend PP T-DaP:Given prenatally Flu: N/A Declined 08/08/24 RSV Vaccine received: Yes 08/08/24 Transfusion:{Transfusion received:30440034}  Immunizations received: Immunization History  Administered Date(s) Administered    sv, Bivalent, Protein Subunit Rsvpref,pf (Abrysvo ) 08/08/2024   MMR  09/08/2018, 01/02/2023   Tdap 06/28/2018, 11/13/2022, 07/11/2024    Physical exam  Vitals:   08/31/24 0530 08/31/24 0600 08/31/24 0804 08/31/24 0806  BP:   119/80 121/67  Pulse:   (!) 121 98  Resp:      Temp:      TempSrc:      SpO2: 99% 99%    Weight:      Height:       General: {Exam; general:21111117} Lochia: {Desc; appropriate/inappropriate:30686::appropriate} Uterine Fundus: {Desc; firm/soft:30687} Incision: N/A DVT Evaluation: {Exam; dvt:2111122} Labs: Lab Results  Component Value Date   WBC 13.4 (H) 08/31/2024   HGB 10.2 (L) 08/31/2024   HCT 32.6 (L) 08/31/2024   MCV 81.9 08/31/2024   PLT 202 08/31/2024      Latest Ref Rng & Units 05/14/2018    4:39 AM  CMP  Glucose 65 - 99 mg/dL 99   BUN 6 - 20 mg/dL <5   Creatinine 9.49 - 1.00 mg/dL 9.58   Sodium 864 - 854 mmol/L 138   Potassium 3.5 - 5.1 mmol/L 3.7   Chloride 101 - 111 mmol/L 105   CO2 22 - 32 mmol/L 24   Calcium 8.9 - 10.3 mg/dL 9.1    Edinburgh Score:    11/01/2018    4:20 PM  Edinburgh Postnatal Depression Scale Screening Tool  I have been able to laugh and see the funny side of things. 0  I have looked forward with enjoyment to things. 1  I have blamed myself unnecessarily when things went wrong. 3  I have been anxious or worried for no good reason. 3  I have felt scared or panicky for no  good reason. 0  Things have been getting on top of me. 3  I have been so unhappy that I have had difficulty sleeping. 0  I have felt sad or miserable. 3  I have been so unhappy that I have been crying. 3  The thought of harming myself has occurred to me. 0  Edinburgh Postnatal Depression Scale Total 16      Data saved with a previous flowsheet row definition   No data recorded  After visit meds:  Allergies as of 08/31/2024   No Known Allergies     Discharge home in stable condition Infant Feeding: Bottle Infant Disposition:home with mother Discharge instruction: per After Visit Summary and  Postpartum booklet. Activity: Advance as tolerated. Pelvic rest for 6 weeks.  Diet: routine diet Future Appointments: Future Appointments  Date Time Provider Department Center  09/05/2024  3:15 PM Cleotilde Ronal RAMAN, MD [facility] [phone] [facility]   Follow up Visit: Message sent 08/31/24.   Please schedule this patient for a In person postpartum visit in 4 weeks with the following provider: Any provider. Additional Postpartum F/U:none  Low risk pregnancy complicated by: short interval pregnancy,  Delivery mode:  Vaginal, Spontaneous Anticipated Birth Control:  IUD OP   08/31/2024 Terrace Compton, MD

## 2024-08-31 NOTE — H&P (Signed)
 OBSTETRIC ADMISSION HISTORY AND PHYSICAL  Sarah Bond is 23 y.o. H6E7997 with IUP at [redacted]w[redacted]d 09/05/2024, by Ultrasound presenting for ctx. In the MAU, NST had moderate variability, accels only 10x10. Accels 15x15 after a snack. Decels also noted, variable and late. BPP 2/8 per prelim report. Admitted for IOL, no questions from patient or partner at this time. She received her prenatal care at [facility]   ROS (+) FM, ctx (-) VB, LOF  Prenatal History/Complications NURSING  PROVIDER  Office Location [facility] Dating by U/S at 13+3 wks  [facility] Model Traditional Anatomy U/S Complete  Initiated care at  14wks                 Language  English               LAB RESULTS   Support Person Uzziel Patricio [employer] NIPS: LR/ Female AFP: screen negative      NT/IT (FT only)        Carrier Screen Horizon: neg (2023)  Rhogam  --/--/O POS (04/11 1817) A1C/GTT Early HgbA1C: 5.5 Third trimester 2 hr GTT: Passed  Flu Vaccine Declined 08/08/24      TDaP Vaccine  07/11/2024 Blood Type --/--/O POS (04/11 1817)  RSV Vaccine UTD (08/08/24) Antibody Negative (04/21 0958)  COVID Vaccine X1 dose Rubella <0.90 (04/21 9041)  Feeding Plan bottle RPR Non Reactive (07/17 0846)  Contraception Mirena IUD ? HBsAg Negative (04/21 9041)  Circumcision no HIV Non Reactive (07/17 0846)  Pediatrician  Dr. Ruffus ([facility]) HCVAb  Neg  Prenatal Classes Info given      BTL Consent   Pap ASCUS +HR HPV  BTL Pre-payment   GC/CT Initial:  neg/neg 36wks:  neg/neg  VBAC Consent   GBS  Negative For PCN allergy, check sensitivities   BRx Optimized? [ ]  yes   [x]  no      DME Rx [x]  BP cuff [ ]  Weight Scale Waterbirth  [ ]  Class [ ]  Consent [ ]  CNM visit  PHQ9 & GAD7 [x]  new OB [x]  28 weeks  [x ] 36 weeks Induction  [ ]  Orders Entered [ ] Foley Y/N   OB History  Gravida Para Term Preterm AB Living  3 2 2  0 0 2  SAB IAB Ectopic Multiple Live Births  0 0 0 0 2    # Outcome Date GA Lbr Len/2nd Weight Sex Type Anes PTL Lv   3 Current           2 Term 01/01/23 [redacted]w[redacted]d  4252 g M Vag-Spont EPI N LIV     Birth Comments: Multicystic dysplastic kidney  1 Term 09/06/18 [redacted]w[redacted]d 10:41 / 00:20 3949 g M Vag-Spont EPI  LIV   Patient Active Problem List   Diagnosis Date Noted   Anemia during pregnancy in third trimester 08/10/2024   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) , currently pregnant, antepartum, not applicable or unspecified fetus 06/08/2024   Short interval between pregnancies affecting pregnancy, antepartum 06/08/2024   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) 06/08/2024   Rubella non-immune status, antepartum 03/15/2024   Supervision of other normal pregnancy, antepartum 03/13/2024   History of gestational diabetes 03/13/2024    Past Medical History: Past Medical History:  Diagnosis Date   Anemia    iron infusion during pregnancy   Depression    Generalized anxiety disorder 03/21/2015   Major depression, single episode 03/21/2015   Multicystic dysplastic kidney, fetal, affecting care  of mother, 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   08/11/22: 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, with multiple cysts)     08/2022 MFM Recs:  -Follow-up ultrasound in 4 weeks to complete fetal anatomy and reevaluate fetal kidneys  -Serial interval fetal growth and to follow-up ultrasound to monitor fetal kidneys  -An appointment with pediatric urologist in third trimester     Suicide attempt (HCC) 03/21/2015    Past Surgical History: Past Surgical History:  Procedure Laterality Date   I & D EXTREMITY Left 08/08/2015   Procedure: IRRIGATION AND DEBRIDEMENT LEFT THUMB WITH REPAIR AND RECONSTRUCTION AS NEEDED;  Surgeon: Elsie Mussel, MD;  Location: [facility] OR;  Service: Orthopedics;  Laterality: Left;    Social History Social History   Socioeconomic History   Marital status: Single    Spouse name: Not on file   Number of children: Not on file   Years of education: Not on file   Highest  education level: Not on file  Occupational History   Not on file  Tobacco Use   Smoking status: Never   Smokeless tobacco: Never  Vaping Use   Vaping status: Never Used  Substance and Sexual Activity   Alcohol use: No   Drug use: Never   Sexual activity: Not Currently    Birth control/protection: None  Other Topics Concern   Not on file  Social History Narrative   ** Merged History Encounter **       Social Drivers of Health   Financial Resource Strain: Low Risk  (03/13/2024)   Overall Financial Resource Strain (CARDIA)    Difficulty of Paying Living Expenses: Not hard at all  Food Insecurity: No Food Insecurity (03/13/2024)   Hunger Vital Sign    Worried About Running Out of Food in the Last Year: Never true    Ran Out of Food in the Last Year: Never true  Transportation Needs: No Transportation Needs (03/13/2024)   PRAPARE - Administrator, Civil Service (Medical): No    Lack of Transportation (Non-Medical): No  Physical Activity: Insufficiently Active (03/13/2024)   Exercise Vital Sign    Days of Exercise per Week: 3 days    Minutes of Exercise per Session: 30 min  Stress: No Stress Concern Present (03/13/2024)   [employer] of Occupational Health - Occupational Stress Questionnaire    Feeling of Stress : Not at all  Social Connections: Moderately Integrated (03/13/2024)   Social Connection and Isolation Panel    Frequency of Communication with Friends and Family: More than three times a week    Frequency of Social Gatherings with Friends and Family: Twice a week    Attends Religious Services: 1 to 4 times per year    Active Member of [employer] or Organizations: No    Attends Engineer, structural: Never    Marital Status: Living with partner    Family History: Family History  Problem Relation Age of Onset   Diabetes Mother    Diabetes Sister    Diabetes Maternal Grandmother    Diabetes Maternal Grandfather     Allergies: No Known  Allergies  Medications Prior to Admission  Medication Sig Dispense Refill Last Dose/Taking   Prenatal Vit-Fe Fumarate-FA (MULTIVITAMIN-PRENATAL) 27-0.8 MG TABS tablet Take 1 tablet by mouth daily at 12 noon.   08/30/2024   valACYclovir  (VALTREX ) 500 MG tablet Take 1 tablet (500 mg total) by mouth 2 (two) times daily. 60 tablet 1 08/30/2024   Ferric Maltol  (ACCRUFER ) 30 MG CAPS Take 1  capsule (30 mg total) by mouth every other day. 15 capsule 10      Review of Systems  All systems reviewed and negative except as stated in HPI  PHYSICAL EXAM Blood pressure 106/60, pulse 99, temperature 98.8 F (37.1 C), temperature source Oral, resp. rate 17, height 5' 5 (1.651 m), weight 79.2 kg, SpO2 98%, not currently breastfeeding. General appearance: alert and cooperative Lungs: respirations nonlabored Heart: regular rate Abdomen: gravid  Fetal monitoringBaseline: 150 bpm, Variability: Good {> 6 bpm), Accelerations: 10x10 only, and Decelerations: late Uterine activityFrequency: Every 3-4 minutes  Dilation: 4 Effacement (%): 70 Station: -3 Exam by:: Dr Danny Presentation: cephalic   Prenatal labs: ABO, Rh: --/--/O POS (04/11 1817) Antibody: Negative (04/21 0958) Rubella: <0.90 (04/21 0958) RPR: Non Reactive (07/17 0846)  HBsAg: Negative (04/21 0958)  HIV: Non Reactive (07/17 0846)   Lab Results  Component Value Date   GBS Negative 08/08/2024    Anatomy US :  There were no obvious fetal anomalies noted on today's ultrasound exam.  The fetal kidneys appeared within normal limits today.  Immunization History  Administered Date(s) Administered    sv, Bivalent, Protein Subunit Rsvpref,pf (Abrysvo ) 08/08/2024   MMR 09/08/2018, 01/02/2023   Tdap 06/28/2018, 11/13/2022, 07/11/2024    Prenatal Transfer Tool  Maternal Diabetes: No Genetic Screening: Normal Maternal Ultrasounds/Referrals: Normal Fetal Ultrasounds or other Referrals:  None Maternal Substance Abuse:  No Significant  Maternal Medications:  None Significant Maternal Lab Results: Group B Strep negative Number of Prenatal Visits:greater than 3 verified prenatal visits Maternal Vaccinations:TDap Other Comments:  None   No results found for this or any previous visit (from the past 24 hours).  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 [id number] with IUP at [redacted]w[redacted]d 09/05/2024, by Ultrasound admitted for IOL.  Sono at 29+2: normal anatomy, cephalic presentation, posterior placenta, EFW 1370g, (37%)  #Labor: IOL #Pain: Per patient preference, encourage ambulation #FWB: Cat II  #short interval pregnancy G2 delivered 12/2022 - AGA as documented above - caution PPH  #HSV1 IgG positive at IOB this pregnancy. No outbreaks per chart review.  - Valtrex  suppression  #RNI - recommend MMR after delivery  #GBS status:  negative #Feeding: Formula #Reproductive Life planning: IUD OP #Circ:  no   Barabara Danny, DO FMOB Fellow, Editor, commissioning, Center for [employer]

## 2024-08-31 NOTE — Anesthesia Preprocedure Evaluation (Signed)
 Anesthesia Evaluation  Patient identified by MRN, date of birth, ID band Patient awake    Reviewed: Allergy & Precautions, NPO status , Patient's Chart, lab work & pertinent test results  Airway Mallampati: II  TM Distance: >3 FB Neck ROM: Full    Dental no notable dental hx.    Pulmonary neg pulmonary ROS   Pulmonary exam normal breath sounds clear to auscultation       Cardiovascular negative cardio ROS Normal cardiovascular exam Rhythm:Regular Rate:Normal     Neuro/Psych  PSYCHIATRIC DISORDERS Anxiety Depression    negative neurological ROS     GI/Hepatic negative GI ROS, Neg liver ROS,,,  Endo/Other  negative endocrine ROS    Renal/GU negative Renal ROS  negative genitourinary   Musculoskeletal negative musculoskeletal ROS (+)    Abdominal   Peds  Hematology negative hematology ROS (+)   Anesthesia Other Findings Presented to MAU with contractions. Admitted for IOL 2/2 BPP 2/8  Reproductive/Obstetrics (+) Pregnancy                              Anesthesia Physical Anesthesia Plan  ASA: 2  Anesthesia Plan: Epidural   Post-op Pain Management:    Induction:   PONV Risk Score and Plan: Treatment may vary due to age or medical condition  Airway Management Planned: Natural Airway  Additional Equipment:   Intra-op Plan:   Post-operative Plan:   Informed Consent: I have reviewed the patients History and Physical, chart, labs and discussed the procedure including the risks, benefits and alternatives for the proposed anesthesia with the patient or authorized representative who has indicated his/her understanding and acceptance.       Plan Discussed with: Anesthesiologist  Anesthesia Plan Comments: (Patient identified. Risks, benefits, options discussed with patient including but not limited to bleeding, infection, nerve damage, paralysis, failed block, incomplete pain  control, headache, blood pressure changes, nausea, vomiting, reactions to medication, itching, and post partum back pain. Confirmed with bedside nurse the patient's most recent platelet count. Confirmed with the patient that they are not taking any anticoagulation, have any bleeding history or any family history of bleeding disorders. Patient expressed understanding and wishes to proceed. All questions were answered. )        Anesthesia Quick Evaluation

## 2024-08-31 NOTE — Lactation Note (Addendum)
 This note was copied from a baby's chart. Lactation Consultation Note  Patient Name: Sarah Bond Unijb'd Date: 08/31/2024 Age:22 hours Reason for consult: Initial assessment;Term  Visited with family of 73 45/12 weeks old female Sarah Bond; Ms. Sarah Bond is a P3 and experienced breastfeeding. She has been taking baby to breast but also supplementing with Similac 20 calorie formula. Her plan is going back to work after 6 weeks, she's unsure about how much longer she'll be breastfeeding after returning to work. Reviewed options such as pumping while away from baby to protect her supply, she'll try to get her insurance pump through Medicaid. Provided a hand pump in the meantime. Sarah Bond already nursing when entered the room but he was getting sleepy already (see LATCH score). Noticed that parents have been supplementing with large amounts of formula (30 ml gone from last bottle). Reviewed normal newborn behavior, feeding cues, cluster feeding, size of baby's stomach, pumping schedule, supplementation and anticipatory guidelines. She politely declined a referral to [facility] OP F/U but has their contact information in case she needs to reach out.  Maternal Data Has patient been taught Hand Expression?: Yes Does the patient have breastfeeding experience prior to this delivery?: Yes How long did the patient breastfeed?: only her second one for 13 months  Feeding Mother's Current Feeding Choice: Breast Milk and Formula  LATCH Score Latch: Repeated attempts needed to sustain latch, nipple held in mouth throughout feeding, stimulation needed to elicit sucking reflex. (getting sleepy)  Audible Swallowing: A few with stimulation  Type of Nipple: Everted at rest and after stimulation  Comfort (Breast/Nipple): Soft / non-tender  Hold (Positioning): No assistance needed to correctly position infant at breast.  LATCH Score: 8  Lactation Tools Discussed/Used Tools: Pump;Flanges Flange Size:  21 Breast pump type: Manual Pump Education: Setup, frequency, and cleaning;Milk Storage Reason for Pumping: patient's request Pumping frequency: PRN or whenever baby is getting a bottle  Interventions Interventions: Breast feeding basics reviewed;Assisted with latch;Hand express;Breast massage;Breast compression;Adjust position;Support pillows;Education;LC Services brochure  Plan Offer the breast +8 times/24 hours or sooner if feeding cues are present Pump or hand express whenever baby is getting a bottle Parents will continue supplementing with Similac 20 calorie formula per feeding choice on admission; will follow supplementation guidelines per baby's age in hours  FOB present and supportive. All questions and concerns answered, family to contact [facility] services PRN.  Discharge Discharge Education: Engorgement and breast care;Outpatient recommendation Pump: Advised to call insurance company;Manual  Consult Status Consult Status: Follow-up Date: 09/01/24 Follow-up type: In-patient   Contrell Ballentine GORMAN Crate 08/31/2024, 4:34 PM

## 2024-08-31 NOTE — Plan of Care (Signed)

## 2024-08-31 NOTE — Anesthesia Procedure Notes (Signed)
 Epidural Patient location during procedure: OB Start time: 08/31/2024 7:23 AM End time: 08/31/2024 7:28 AM  Staffing Anesthesiologist: Peggye Delon Brunswick, MD Performed: anesthesiologist   Preanesthetic Checklist Completed: patient identified, IV checked, risks and benefits discussed, monitors and equipment checked, pre-op evaluation and timeout performed  Epidural Patient position: sitting Prep: DuraPrep and site prepped and draped Patient monitoring: continuous pulse ox and blood pressure Approach: midline Location: L3-L4 Injection technique: LOR saline  Needle:  Needle insertion depth: 4.5 cm Needle type: Tuohy  Needle gauge: 17 G Needle length: 9 cm and 9 Needle insertion depth: 4.5 cm Catheter type: closed end flexible Catheter size: 19 Gauge Catheter at skin depth: 8.5 cm Test dose: negative  Assessment Events: blood not aspirated, no cerebrospinal fluid, injection not painful, no injection resistance, no paresthesia and negative IV test  Additional Notes The patient has requested an epidural for labor pain management. Risks and benefits including, but not limited to, infection, bleeding, local anesthetic toxicity, headache, hypotension, back pain, block failure, etc. were discussed with the patient. The patient expressed understanding and consented to the procedure. I confirmed that the patient has no bleeding disorders and is not taking blood thinners. I confirmed the patient's last platelet count with the nurse. A time-out was performed immediately prior to the procedure. Please see nursing documentation for vital signs. Sterile technique was used throughout the whole procedure. Once LOR achieved, the epidural catheter threaded easily without resistance. Aspiration of the catheter was negative for blood and CSF. The epidural was dosed slowly and an infusion was started.  1 attempt(s)Reason for block:procedure for pain

## 2024-09-01 ENCOUNTER — Encounter: Payer: Self-pay | Admitting: Certified Nurse Midwife

## 2024-09-01 ENCOUNTER — Other Ambulatory Visit (HOSPITAL_COMMUNITY): Payer: Self-pay

## 2024-09-01 MED ORDER — SENNOSIDES-DOCUSATE SODIUM 8.6-50 MG PO TABS
2.0000 | ORAL_TABLET | Freq: Every day | ORAL | 0 refills | Status: DC
  Filled 2024-09-01: qty 60, 30d supply, fill #0

## 2024-09-01 MED ORDER — IBUPROFEN 600 MG PO TABS
600.0000 mg | ORAL_TABLET | Freq: Four times a day (QID) | ORAL | 0 refills | Status: DC
  Filled 2024-09-01: qty 30, 8d supply, fill #0

## 2024-09-01 MED ORDER — PRENATAL MULTIVITAMIN CH
1.0000 | ORAL_TABLET | Freq: Every day | ORAL | 0 refills | Status: AC
  Filled 2024-09-01: qty 30, 30d supply, fill #0

## 2024-09-01 MED ORDER — ACETAMINOPHEN 500 MG PO TABS
1000.0000 mg | ORAL_TABLET | Freq: Four times a day (QID) | ORAL | 0 refills | Status: DC
  Filled 2024-09-01: qty 30, 4d supply, fill #0

## 2024-09-01 NOTE — Anesthesia Postprocedure Evaluation (Signed)
 Anesthesia Post Note  Patient: Sarah Bond  Procedure(s) Performed: AN AD HOC LABOR EPIDURAL     Patient location during evaluation: Mother Baby Anesthesia Type: Epidural Level of consciousness: awake and alert and oriented Pain management: satisfactory to patient Vital Signs Assessment: post-procedure vital signs reviewed and stable Respiratory status: respiratory function stable Cardiovascular status: stable Postop Assessment: no headache, no backache, epidural receding, patient able to bend at knees, no signs of nausea or vomiting, adequate PO intake and able to ambulate Anesthetic complications: no   No notable events documented.  Last Vitals:  Vitals:   08/31/24 2242 09/01/24 0440  BP: 101/65 97/64  Pulse: 74 72  Resp: 17 16  Temp: 36.6 C 36.7 C  SpO2: 98% 98%    Last Pain:  Vitals:   09/01/24 0844  TempSrc:   PainSc: 0-No pain   Pain Goal:                Epidural/Spinal Function Cutaneous sensation: Normal sensation (09/01/24 0844)  PURNELL PORTAL

## 2024-09-01 NOTE — Social Work (Signed)
 MOB was referred for history of depression/anxiety.  * Referral screened out by Clinical Social Worker because none of the following criteria appear to apply:  ~ History of anxiety/depression during this pregnancy, or of post-partum depression following prior delivery.  ~ Diagnosis of anxiety and/or depression within last 3 years OR * MOB's symptoms currently being treated with medication and/or therapy.  Per chart review MOB diagnosis date back to 2016. Per OB records no MH concerns noted during this pregnancy. Edinburgh=5  Please contact the Clinical Social Worker if needs arise, or by MOB request.   Eliazar Gave, LCSWA Clinical Social Worker [phone]

## 2024-09-01 NOTE — Lactation Note (Signed)
 This note was copied from a baby's chart. Lactation Consultation Note  Patient Name: Sarah Bond Date: 09/01/2024 Age:22 hours Reason for consult: Follow-up assessment;Maternal discharge;Term. See MR- hx of anemia, infant has not had weight weight change in birth weight 3810 grams to 3895 grams today.  P3, term female infant. Per MOB, she feels breastfeeding is going well, infant is feeding today for 15 minutes most feeding. Her feeding choice is breast and bottle feeding infant. MOB is experienced with breastfeeding she breastfeed her second child for one year. LC discussed with MOB to latch infant first every feeding and offer both breast before supplementing infant with formula. LC discussed that on day 2 infant may be cluster feeding and this is a normal pattern of behavior. MOB will continue to breastfeed infant by cues, on demand, 8-12 times within 24 hours. Infant was recently feed, LC did not observe latch at this time.  Discharge education: 1- Continue to latch infant first every feeding by cues, on demand, every 2-3 hours, skin to skin. 2- LC discussed engorgement prevention and treatment. 3- LC discussed warning signs of dehydration in infant. 4- MOB given list of Community resources after hospital discharge: [facility] hotline, LC breastfeeding support group and [facility] outpatient clinic.  Maternal Data    Feeding Mother's Current Feeding Choice: Breast Milk and Formula  LATCH Score Latch: Grasps breast easily, tongue down, lips flanged, rhythmical sucking.  Audible Swallowing: A few with stimulation  Type of Nipple: Everted at rest and after stimulation  Comfort (Breast/Nipple): Soft / non-tender  Hold (Positioning): No assistance needed to correctly position infant at breast.  LATCH Score: 9   Lactation Tools Discussed/Used    Interventions    Discharge Discharge Education: Engorgement and breast care;Warning signs for feeding baby  Consult  Status Consult Status: Complete Date: 09/01/24 Follow-up type: Physician    Grayce LULLA Batter 09/01/2024, 11:15 AM

## 2024-09-01 NOTE — Patient Instructions (Signed)
 If interested in an outpatient lactation consult in office or virtually please reach out to us  at [facility] (First Floor) [address] [place]  Please feel free to out with any lactation related questions or concerns [phone]  to leave a message for our lactation voicemail box.  Lactation support groups:  [facility], Tuesdays 10:00 am -12:00 pm at [address] on the second floor in the conference room, lactating parents and lap babies welcome.  [email or web address]  [email or web address]      Sarah Bond, [facility]

## 2024-09-04 ENCOUNTER — Encounter: Payer: Self-pay | Admitting: Certified Nurse Midwife

## 2024-09-04 ENCOUNTER — Ambulatory Visit: Payer: Self-pay | Admitting: Obstetrics & Gynecology

## 2024-09-04 LAB — SURGICAL PATHOLOGY

## 2024-09-05 ENCOUNTER — Encounter (HOSPITAL_BASED_OUTPATIENT_CLINIC_OR_DEPARTMENT_OTHER): Payer: Self-pay | Admitting: Obstetrics & Gynecology

## 2024-09-09 ENCOUNTER — Telehealth (HOSPITAL_COMMUNITY): Payer: Self-pay | Admitting: *Deleted

## 2024-09-09 NOTE — Telephone Encounter (Signed)
 Attempted hospital discharge follow-up call. Left message for patient to return RN call with any questions or concerns. Allean IVAR Carton, RN, 09/09/24, [phone]

## 2024-10-11 ENCOUNTER — Ambulatory Visit (INDEPENDENT_AMBULATORY_CARE_PROVIDER_SITE_OTHER): Payer: Self-pay | Admitting: Certified Nurse Midwife

## 2024-10-11 ENCOUNTER — Encounter (HOSPITAL_BASED_OUTPATIENT_CLINIC_OR_DEPARTMENT_OTHER): Payer: Self-pay | Admitting: Certified Nurse Midwife

## 2024-10-11 VITALS — BP 94/59 | HR 79 | Ht 65.0 in | Wt 156.6 lb

## 2024-10-11 DIAGNOSIS — Z1332 Encounter for screening for maternal depression: Secondary | ICD-10-CM

## 2024-10-11 DIAGNOSIS — Z30430 Encounter for insertion of intrauterine contraceptive device: Secondary | ICD-10-CM | POA: Diagnosis not present

## 2024-10-11 MED ORDER — PARAGARD INTRAUTERINE COPPER IU IUD
1.0000 | INTRAUTERINE_SYSTEM | Freq: Once | INTRAUTERINE | Status: AC
  Administered 2024-10-11: 1 via INTRAUTERINE

## 2024-10-11 NOTE — Addendum Note (Signed)
 Addended by: DARRYLE PRESIDENT B on: 10/11/2024 02:56 PM   Modules accepted: Orders

## 2024-10-11 NOTE — Progress Notes (Signed)
 Post Partum Visit Note  Sarah Bond is a 23 y.o. [id number] female who presents for a postpartum visit. She is 6 weeks postpartum following a SVD.  I have fully reviewed the prenatal and intrapartum course. The delivery was at 39 gestational weeks.  Anesthesia: epidural. Postpartum course has been uneventful. Baby is doing well. Baby is feeding by breast and bottlefeeding. Bleeding staining only. Bowel function is normal. Bladder function is normal. Patient will be  sexually active. Contraception method is Desires Copper IUD today. Postpartum depression screening: negative.   The pregnancy intention screening data noted above was reviewed. Potential methods of contraception were discussed. The patient elected to proceed with No data recorded.    Health Maintenance Due  Topic Date Due   COVID-19 Vaccine (1) Never done   HPV VACCINES (1 - 3-dose series) Never done   Meningococcal B Vaccine (1 of 2 - Standard) Never done   Hepatitis B Vaccines 19-59 Average Risk (1 of 3 - 19+ 3-dose series) Never done   Influenza Vaccine  Never done    The following portions of the patient's history were reviewed and updated as appropriate: allergies, current medications, past family history, past medical history, past social history, past surgical history, and problem list.  Review of Systems Pertinent items are noted in HPI.  Objective:  LMP  (LMP Unknown)    General:  alert, cooperative, and appears stated age   Breasts:  lactating  Lungs: clear to auscultation bilaterally  Heart:  regular rate and rhythm, S1, S2 normal, no murmur, click, rub or gallop  Abdomen: soft, non-tender; bowel sounds normal; no masses,  no organomegaly   Wound   GU exam:  normal       Assessment:   1. Encounter for insertion of ParaGard IUD (Primary) - Pt desires ParaGard IUD for contraception.  2. Postpartum care following vaginal delivery - Breast and bottlefeeding - Postpartum depression/anxiety screen  negative  3. Lactating mother    Plan:   Essential components of care per ACOG recommendations:  1.  Mood and well being: Patient with negative depression screening today. Reviewed local resources for support.  - Patient tobacco use? No.   - hx of drug use? No.    2. Infant care and feeding:  -Patient currently breastmilk feeding? Yes  -Social determinants of health (SDOH) reviewed in EPIC. No concerns  3. Sexuality, contraception and birth spacing - Patient does not want a pregnancy in the next year.  Desired family size is Unsure  children.  - Reviewed reproductive life planning. Reviewed contraceptive methods based on pt preferences and effectiveness.  Patient desired IUD or IUS today.   - Discussed birth spacing of 18 months  4. Sleep and fatigue -Encouraged family/partner/community support of 4 hrs of uninterrupted sleep to help with mood and fatigue  5. Physical Recovery  - Discussed patients delivery and complications. She describes her labor as good. - Patient had a Vaginal, no problems at delivery. Patient had no laceration. Perineal healing reviewed. Patient expressed understanding - Patient has urinary incontinence? No. - Patient will be  safe to resume physical and sexual activity  6.  Health Maintenance - HM due items addressed Yes - Last pap smear  Diagnosis  Date Value Ref Range Status  03/13/2024 (A)  Final   - Atypical squamous cells of undetermined significance (ASC-US )   Pap smear planned for April 2026 (follow-up) discussed  at today's visit.  -Breast Cancer screening indicated? No.   7.  Chronic Disease/Pregnancy Condition follow up: No GDM    GYNECOLOGY OFFICE PROCEDURE NOTE  Sarah Bond is a 23 y.o. [id number] here for Paragard   insertion. No GYN concerns.    IUD Insertion Procedure Note Patient identified, informed consent performed, consent signed.   Discussed risks of irregular bleeding, cramping, infection, malpositioning or  misplacement of the IUD outside the uterus which may require further procedure such as laparoscopy. Time out was performed.  Urine pregnancy test negative.  Speculum placed in the vagina.  Cervix visualized.  Cleaned with Betadine x 2.  Grasped anteriorly with a single tooth tenaculum.  Uterus sounded to 7 cm.   IUD placed per manufacturer's recommendations.  Strings trimmed to 3 cm. Tenaculum was removed, good hemostasis noted.  Patient tolerated procedure well.   Patient was given post-procedure instructions.  She was advised to have backup contraception for one week.  Patient was also asked to check IUD strings periodically and follow up in 4-8 weeks  for IUD check.  Return in 2 months (on 12/11/2024) for Return 2 months for IUD check, return April 2026, annual gyn exam.   Arland MARLA Roller, CNM 10:02 AM

## 2024-12-14 ENCOUNTER — Ambulatory Visit (HOSPITAL_BASED_OUTPATIENT_CLINIC_OR_DEPARTMENT_OTHER): Admitting: Obstetrics and Gynecology

## 2024-12-27 ENCOUNTER — Other Ambulatory Visit (HOSPITAL_COMMUNITY): Payer: Self-pay

## 2025-01-02 ENCOUNTER — Ambulatory Visit (HOSPITAL_BASED_OUTPATIENT_CLINIC_OR_DEPARTMENT_OTHER): Admitting: Obstetrics and Gynecology
